# Patient Record
Sex: Female | Born: 1945 | Race: White | Hispanic: No | Smoking: Never smoker
Health system: Southern US, Community
[De-identification: ages and names within clinical notes are randomized; demographics above are authoritative.]

## PROBLEM LIST (undated history)

## (undated) DIAGNOSIS — R000 Tachycardia, unspecified: Secondary | ICD-10-CM

## (undated) DIAGNOSIS — G8929 Other chronic pain: Secondary | ICD-10-CM

## (undated) DIAGNOSIS — M2550 Pain in unspecified joint: Secondary | ICD-10-CM

## (undated) DIAGNOSIS — R519 Headache, unspecified: Secondary | ICD-10-CM

## (undated) DIAGNOSIS — J189 Pneumonia, unspecified organism: Secondary | ICD-10-CM

## (undated) DIAGNOSIS — R0600 Dyspnea, unspecified: Secondary | ICD-10-CM

## (undated) DIAGNOSIS — R51 Headache: Secondary | ICD-10-CM

## (undated) HISTORY — PX: UTERINE FIBROID SURGERY: SHX826

## (undated) HISTORY — DX: Dyspnea, unspecified: R06.00

## (undated) HISTORY — PX: TONSILLECTOMY: SHX5217

---

## 2000-01-05 ENCOUNTER — Encounter: Payer: Self-pay | Admitting: Obstetrics and Gynecology

## 2000-01-05 ENCOUNTER — Encounter: Admission: RE | Admit: 2000-01-05 | Discharge: 2000-01-05 | Payer: Self-pay | Admitting: Obstetrics and Gynecology

## 2013-12-24 ENCOUNTER — Other Ambulatory Visit: Payer: Self-pay | Admitting: Internal Medicine

## 2013-12-24 DIAGNOSIS — R0602 Shortness of breath: Secondary | ICD-10-CM

## 2013-12-25 ENCOUNTER — Ambulatory Visit
Admission: RE | Admit: 2013-12-25 | Discharge: 2013-12-25 | Disposition: A | Discharge status: Home / Self Care | Payer: Medicare Other | Source: Ambulatory Visit | Attending: Internal Medicine | Admitting: Internal Medicine

## 2013-12-25 DIAGNOSIS — R0602 Shortness of breath: Secondary | ICD-10-CM

## 2013-12-25 MED ORDER — IOHEXOL 300 MG/ML  SOLN
75.0000 mL | Freq: Once | INTRAMUSCULAR | Status: AC | PRN
  Administered 2013-12-25: 75 mL via INTRAVENOUS

## 2014-01-01 ENCOUNTER — Telehealth: Payer: Self-pay | Admitting: Emergency Medicine

## 2014-01-01 ENCOUNTER — Encounter (INDEPENDENT_AMBULATORY_CARE_PROVIDER_SITE_OTHER): Payer: Self-pay

## 2014-01-01 ENCOUNTER — Encounter: Payer: Self-pay | Admitting: Internal Medicine

## 2014-01-01 ENCOUNTER — Ambulatory Visit (INDEPENDENT_AMBULATORY_CARE_PROVIDER_SITE_OTHER): Payer: Medicare Other | Admitting: Internal Medicine

## 2014-01-01 VITALS — BP 110/80 | HR 90 | Ht 64.0 in | Wt 138.0 lb

## 2014-01-01 DIAGNOSIS — R599 Enlarged lymph nodes, unspecified: Secondary | ICD-10-CM

## 2014-01-01 DIAGNOSIS — R590 Localized enlarged lymph nodes: Secondary | ICD-10-CM

## 2014-01-01 DIAGNOSIS — D6862 Lupus anticoagulant syndrome: Secondary | ICD-10-CM

## 2014-01-01 DIAGNOSIS — R0989 Other specified symptoms and signs involving the circulatory and respiratory systems: Secondary | ICD-10-CM

## 2014-01-01 DIAGNOSIS — J849 Interstitial pulmonary disease, unspecified: Secondary | ICD-10-CM

## 2014-01-01 NOTE — Patient Instructions (Signed)
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 Depending on those results, we will order the balance next few days  - also refer to DR Gavin Pound for a sooner appointment  Followup  - depending on results of blood tests, will arrange for biopsy - likely bronchoscopy as first step

## 2014-01-01 NOTE — Telephone Encounter (Signed)
Per MR's request.   Called and left a message for medical records to have the pt's autoimmune labs faxed to our office.   Labs are: ACE level, MPO/pr-3, ANA, DS DNA, ESR, RF, anti-CCP, scl 70, ssA, ssB, CK total, aldolase, hypersensitivity pneumonitis panel, ANCA.

## 2014-01-01 NOTE — Progress Notes (Signed)
Subjective:    Patient ID: [name], female    DOB: 03-29-1945, 68 y.o.   MRN: [id number] PCP Merrilee Seashore, MD  HPI  IOV 01/01/2014  Chief Complaint  Patient presents with  . Pulmonary Consult    Pt referred by Dr. Ashby Dawes for SOB with activity.     68 year old female  Background history of systemic lupus erythematosus not otherwise specified. According to her history this was diagnosed in the 60s in [facility] by then rheumatologist Dr. Derryl Harbor. This was based on a history of arthralgia and fleeting malar rash and fatigue. Patient was on observation only therapy and subsequently symptoms resolved. Then in 2012 apparently she had some recurrence of symptoms and was seen by Dr. Gavin Pound and was discharged from follow-up. Since then she has done well until now.  Most recently in February 2015 she developed insidious onset of dyspnea on exertion associated with some retrocardiac chest heaviness. This persisted for a few months within resolved but recurred again 3-4 months ago. It is brought on by exertion for walking incline or climbing 2 flights of stairs so basically class II dyspnea on exertion relieved by rest. She did not notice any dyspnea when we walked her 185 feet 3 laps on room air in the office. Symptoms might be somewhat progressive but they're not associated with wheezing, chest tightness, hemoptysis, orthopnea, paroxysmal nocturnal dyspnea or rash or arthralgia. There is some associated cough and postnasal drip. She describes the symptoms as moderate to severe in intensity.  In association with these symptoms she has had some up-and-down parotid enlargement which Dr. Ashby Dawes has elegantly documented in his referral note  She originally saw Dr. Ashby Dawes 12/16/2013. A follow-up visit was done 12/24/2013 with chest x-ray suggesting interstitial lung disease. This was followed by a CT scan of the chest with angiogram 12/25/2013 which I personally  reviewed showing significant mediastinal adenopathy and scattered bilateral groundglass opacities that is consistent with interstitial lung disease of NON-UIP pattern [does not fit in with IPF disease with honeycombing and bilateral subpleural disease and traction bronchiectasis]. There is also some abdominal lymphadenopathy   Walking desaturation test 185 feet 3 labs on room air in the pulmonary office: Did not desaturate  She tells a cardiac stress test with Dr. Einar Gip was negative. 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 I'm not sure that Dr Einar Gip is aware of this  At this point she has had cyclic citrulline antibody and rheumatoid factor done by Dr. Ashby Dawes and results are pending. Sedimentation rate at his office was only 18    has a past medical history of Dyspnea.   reports that she has never smoked. She has never used smokeless tobacco.   has past surgical history that includes Tonsillectomy and Uterine fibroid surgery.  Not on File   There is no immunization history on file for this patient.  Current outpatient prescriptions:aspirin 81 MG tablet, Take 81 mg by mouth daily., Disp: , Rfl: ;  levofloxacin (LEVAQUIN) 500 MG tablet, Take 1 tablet by mouth daily., Disp: , Rfl:    Review of Systems  Constitutional: Negative for fever and unexpected weight change.  HENT: Positive for congestion and postnasal drip. Negative for dental problem, ear pain, nosebleeds, rhinorrhea, sinus pressure, sneezing, sore throat and trouble swallowing.   Eyes: Negative for redness and itching.  Respiratory: Positive for cough and shortness of breath. Negative for chest tightness and wheezing.   Cardiovascular: Positive  for chest pain. Negative for palpitations and leg swelling.  Gastrointestinal: Negative for nausea and vomiting.  Genitourinary: Negative for dysuria.  Musculoskeletal: Negative for joint swelling.  Skin:  Negative for rash.  Neurological: Negative for headaches.  Hematological: Does not bruise/bleed easily.  Psychiatric/Behavioral: Negative for dysphoric mood. The patient is not nervous/anxious.        Objective:   Physical Exam  Vitals reviewed. Constitutional: She is oriented to person, place, and time. She appears well-developed and well-nourished. No distress.  HENT:  Head: Normocephalic and atraumatic.  Right Ear: External ear normal.  Left Ear: External ear normal.  Mouth/Throat: Oropharynx is clear and moist. No oropharyngeal exudate.  Eyes: Conjunctivae and EOM are normal. Pupils are equal, round, and reactive to light. Right eye exhibits no discharge. Left eye exhibits no discharge. No scleral icterus.  Neck: Normal range of motion. Neck supple. No JVD present. No tracheal deviation present. No thyromegaly present.  Cardiovascular: Normal rate, regular rhythm, normal heart sounds and intact distal pulses.  Exam reveals no gallop and no friction rub.   No murmur heard. Pulmonary/Chest: Effort normal. No respiratory distress. She has no wheezes. She has rales. She exhibits no tenderness.   scattered crackles  Abdominal: Soft. Bowel sounds are normal. She exhibits no distension and no mass. There is no tenderness. There is no rebound and no guarding.  Musculoskeletal: Normal range of motion. She exhibits no edema and no tenderness.  Lymphadenopathy:    She has no cervical adenopathy.  Neurological: She is alert and oriented to person, place, and time. She has normal reflexes. No cranial nerve deficit. She exhibits normal muscle tone. Coordination normal.  Skin: Skin is warm and dry. No rash noted. She is not diaphoretic. No erythema. No pallor.  Psychiatric: She has a normal mood and affect. Her behavior is normal. Judgment and thought content normal.    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 Most concerning is autoimmune disease such as vascular disease such as granulomatous polyangiitis, lupus. Alternate differential diagnosis is known autoimmune interstitial lung disease such as sarcoidosis, and NSIP, Boop. Malignant differential diagnoses include lymphoma. Infectious differential diagnoses include tuberculosis but highly unlikely  Plan - Will get PFT   - She needs extensive autoimmune profile testing. I will clarify with Dr. Ashby Dawes the extent of test she has had. In addition I will see if Dr. Gavin Pound can do a detailed rheumatologic evaluation on her as soon as possible  - If these are negative then she needs endobronchial ultrasound-guided transbronchial needle aspiration biopsy of the distal nodes along with bronchoalveolar lavage and possible transbronchial biopsy of the groundglass infiltrates  - If the bronchoscopy is nondiagnostic then we will need to move to surgical lung biopsy for interstitial lung disease and mediastinoscopy of the mediastinal nodes   Patient is aware of this plan. She understands the limitations of these procedures and the risks involved in brief detail only. She is willing to proceed as per plan    Dr. Brand Males, M.D., [facility].C.P Pulmonary and Critical Care Medicine Staff Physician [place] [facility]monary and Critical Care Pager: [phone], If no answer or between  15:00h - 7:00h: call [phone]  01/02/2014 1:05 AM

## 2014-01-02 NOTE — Telephone Encounter (Signed)
[name]  Depending on what you get from [facility]. I wil need all of the following  Serum: ACE, ANA, DS-DNA, RF, CCP ssA, ssB, scl-70, ANCA screen, MPO antibody, PR-3 antibody, Lupus anticoagulant profile, Total CK,  Aldolase,  Hypersensitivity Pneumonitis Panel. I think Dr Ashby Dawes did only RF and CCP  Order the things above they have not ordered  Keep me posted on  ABOVE and b) when she is seenig Dr Trudie Reed is because I would need to schedule bronch on her ASAP depending on all this   Dr. Brand Males, M.D., [facility].C.P Pulmonary and Critical Care Medicine Staff Physician [place] Pulmonary and Critical Care Pager: [phone], If no answer or between  15:00h - 7:00h: call [phone]  01/02/2014 1:12 AM

## 2014-01-02 NOTE — Telephone Encounter (Signed)
Received call from [place]. Dr. Ashby Dawes collected ANA, ESR and CCP. Labs faxed to triage. Labs placed in MR's look ats.  Called and spoke to pt. ADvised pt to come in to have labs drawn, pt stated she will come in on Friday 10/30 after her PFT to have labs drawn. Pt also stated she will call when she gets appt with Dr. Trudie Reed so I can inform MR. Will await call.  Will forward to MR to make aware.

## 2014-01-03 ENCOUNTER — Ambulatory Visit (HOSPITAL_COMMUNITY)
Admission: RE | Admit: 2014-01-03 | Discharge: 2014-01-03 | Disposition: A | Discharge status: Home / Self Care | Payer: Medicare Other | Source: Ambulatory Visit | Attending: Adult Health | Admitting: Adult Health

## 2014-01-03 ENCOUNTER — Other Ambulatory Visit: Payer: Medicare Other

## 2014-01-03 DIAGNOSIS — R0602 Shortness of breath: Secondary | ICD-10-CM | POA: Diagnosis present

## 2014-01-03 DIAGNOSIS — J849 Interstitial pulmonary disease, unspecified: Secondary | ICD-10-CM

## 2014-01-03 DIAGNOSIS — D6862 Lupus anticoagulant syndrome: Secondary | ICD-10-CM

## 2014-01-03 DIAGNOSIS — R062 Wheezing: Secondary | ICD-10-CM | POA: Insufficient documentation

## 2014-01-03 DIAGNOSIS — R590 Localized enlarged lymph nodes: Secondary | ICD-10-CM

## 2014-01-03 LAB — PULMONARY FUNCTION TEST
DL/VA % pred: 64 %
DL/VA: 3.15 ml/min/mmHg/L
DLCO UNC % PRED: 46 %
DLCO UNC: 11.59 ml/min/mmHg
FEF 25-75 Post: 1.11 L/sec
FEF 25-75 Pre: 1.08 L/sec
FEF2575-%Change-Post: 3 %
FEF2575-%Pred-Post: 54 %
FEF2575-%Pred-Pre: 52 %
FEV1-%CHANGE-POST: 0 %
FEV1-%PRED-POST: 72 %
FEV1-%Pred-Pre: 71 %
FEV1-Post: 1.73 L
FEV1-Pre: 1.72 L
FEV1FVC-%CHANGE-POST: 3 %
FEV1FVC-%Pred-Pre: 90 %
FEV6-%Change-Post: -2 %
FEV6-%PRED-PRE: 80 %
FEV6-%Pred-Post: 79 %
FEV6-POST: 2.39 L
FEV6-PRE: 2.44 L
FEV6FVC-%CHANGE-POST: 0 %
FEV6FVC-%PRED-PRE: 103 %
FEV6FVC-%Pred-Post: 104 %
FVC-%CHANGE-POST: -3 %
FVC-%PRED-PRE: 78 %
FVC-%Pred-Post: 75 %
FVC-PRE: 2.46 L
FVC-Post: 2.39 L
POST FEV6/FVC RATIO: 100 %
PRE FEV6/FVC RATIO: 99 %
Post FEV1/FVC ratio: 72 %
Pre FEV1/FVC ratio: 70 %
RV % PRED: 71 %
RV: 1.55 L
TLC % PRED: 81 %
TLC: 4.18 L

## 2014-01-03 MED ORDER — ALBUTEROL SULFATE (2.5 MG/3ML) 0.083% IN NEBU
2.5000 mg | INHALATION_SOLUTION | Freq: Once | RESPIRATORY_TRACT | Status: AC
  Administered 2014-01-03: 2.5 mg via RESPIRATORY_TRACT

## 2014-01-03 NOTE — Telephone Encounter (Signed)
Pt has called in stating she has an appointment with the rheumatologist on 01/09/14.  [name]

## 2014-01-03 NOTE — Telephone Encounter (Signed)
Will forward to MR to make aware that pt has appt with rheumatologist on 01/09/14.

## 2014-01-05 LAB — CK TOTAL AND CKMB (NOT AT ARMC)
CK, MB: 2.2 ng/mL (ref 0.0–5.0)
Relative Index: 5 — ABNORMAL HIGH (ref 0.0–4.0)
Total CK: 44 U/L (ref 7–177)

## 2014-01-05 LAB — RHEUMATOID FACTOR: Rhuematoid fact SerPl-aCnc: <10 IU/mL (ref <=14)

## 2014-01-05 NOTE — Telephone Encounter (Signed)
Thanks.  [name]  So, we need to fax all our rheumatology results to Dr Gavin Pound along with my note: well before the appt. On that day I 01/09/14 I will be in [name] but please leave message with Dr Trudie Reed for her to text me/call me; you can give my number.  Please send the note back so I Can track  Thanks  Dr. Brand Males, M.D., [facility].C.P Pulmonary and Critical Care Medicine Staff Physician [place] [facility]monary and Critical Care Pager: [phone], If no answer or between  15:00h - 7:00h: call [phone]  01/05/2014 8:03 PM

## 2014-01-06 ENCOUNTER — Encounter (HOSPITAL_COMMUNITY): Payer: Self-pay | Admitting: *Deleted

## 2014-01-06 LAB — ANCA TITERS: P-ANCA: 1:160 {titer} — ABNORMAL HIGH

## 2014-01-06 LAB — SJOGRENS SYNDROME-A EXTRACTABLE NUCLEAR ANTIBODY: SSA (Ro) (ENA) Antibody, IgG: <1

## 2014-01-06 LAB — ANCA SCREEN W REFLEX TITER
Atypical p-ANCA Screen: NEGATIVE
C-ANCA SCREEN: NEGATIVE
p-ANCA Screen: POSITIVE — AB

## 2014-01-06 LAB — MPO/PR-3 (ANCA) ANTIBODIES
Myeloperoxidase Abs: <1
Serine Protease 3: <1

## 2014-01-06 LAB — ANGIOTENSIN CONVERTING ENZYME: Angiotensin-Converting Enzyme: 64 U/L — ABNORMAL HIGH (ref 8–52)

## 2014-01-06 LAB — SJOGRENS SYNDROME-B EXTRACTABLE NUCLEAR ANTIBODY: SSB (LA) (ENA) ANTIBODY, IGG: NEGATIVE

## 2014-01-06 LAB — ANTI-DNA ANTIBODY, DOUBLE-STRANDED: ds DNA Ab: 2 IU/mL

## 2014-01-07 LAB — ALDOLASE: Aldolase: 6 U/L (ref <=8.1)

## 2014-01-07 NOTE — Telephone Encounter (Signed)
OV and labs sent. Called [facility] and gave MR's cell number for Dr. Trudie Reed to speak with MR about pt on appt date. [facility] is aware I sent OV and labs and to contact us back if they have not been received. Nothing further needed at this time.   Will forward to MR to make aware.

## 2014-01-09 NOTE — Telephone Encounter (Signed)
Rob  She has mediastinal adenopathy with infiltrates. ACE high. Autoimmune negative. Gavin Pound her rheum called me: autoimmune ruled out. DDx is sarcoid followed by lymphoma. I have her on EBUS schedule 01/20/14 Monday but I will be [place]. Can you do EBUS and Tbbx on 01/20/14 Monday for me?  Thanks  Dr. Brand Males, M.D., [facility].C.P Pulmonary and Critical Care Medicine Staff Physician [place] Pulmonary and Critical Care Pager: [phone], If no answer or between  15:00h - 7:00h: call [phone]  01/09/2014 11:01 PM

## 2014-01-11 LAB — HYPERSENSITIVITY PNUEMONITIS PROFILE

## 2014-01-13 NOTE — Telephone Encounter (Signed)
[name] returned call. Informed [name] of the change. [name] stated they do not page the physician on the case and unable to perform fluoro.   Will forward to MR to make aware.

## 2014-01-13 NOTE — Telephone Encounter (Signed)
Rob  Ptaient set for EBUS 01/20/14 with you. Please confirm on schedule. Also, when we asked for fluoro they at [facility] did not seem to think is possible. Please clarify with them. tHings needed  A. EBUS for mediastinal node - flow and cultures depending on site results  B. Lung infiltrates - BAL (cell count for sure and other stuff)  and transbronchial biopsy    Dr. Brand Males, M.D., [facility].C.P Pulmonary and Critical Care Medicine Staff Physician [place] Pulmonary and Critical Care Pager: [phone], If no answer or between  15:00h - 7:00h: call [phone]  01/13/2014 5:14 PM

## 2014-01-13 NOTE — Telephone Encounter (Signed)
Called and spoke to pt. Informed pt of the change.Pt verbalized understanding and denied any further questions or concerns at this time.  Called and left message with RT at [facility].

## 2014-01-13 NOTE — Telephone Encounter (Signed)
[name]  Let Kristin Bruins know that biopsy on 01/20/14 will be dne by Dr Baltazar Apo because of my travel. LEt her know Dr Lamonte Sakai expert in this procedure. Also, call [phone] and change procedure to his name and let them page Dr Lamonte Sakai with a time on the case. Let me know when confirmed  Thanks  Dr. Brand Males, M.D., [facility].C.P Pulmonary and Critical Care Medicine Staff Physician [place] Pulmonary and Critical Care Pager: [phone], If no answer or between  15:00h - 7:00h: call [phone]  01/13/2014 2:44 PM

## 2014-01-14 NOTE — Telephone Encounter (Signed)
[name]  Please tell [phone]  That after EBUS Dr Lamonte Sakai will do transbronchial biopsy and for this reason will need fluoro  THanks  Dr. Brand Males, M.D., [facility].C.P Pulmonary and Critical Care Medicine Staff Physician [place] Pulmonary and Critical Care Pager: [phone], If no answer or between  15:00h - 7:00h: call [phone]  01/14/2014 5:24 PM

## 2014-01-16 LAB — LUPUS ANTICOAGULANT PANEL
DRVVT: 41.1 s (ref <42.9)
Lupus Anticoagulant: NOT DETECTED
PTT Lupus Anticoagulant: 40.2 secs (ref 28.0–43.0)

## 2014-01-17 NOTE — Telephone Encounter (Signed)
Called and left message on RT's number.

## 2014-01-19 NOTE — Anesthesia Preprocedure Evaluation (Addendum)
Anesthesia Evaluation  Patient identified by MRN, date of birth, ID band Patient awake    Reviewed: Allergy & Precautions, H&P , NPO status , Patient's Chart, lab work & pertinent test results  Airway Mallampati: II  TM Distance: >3 FB Neck ROM: full    Dental no notable dental hx. (+) Teeth Intact, Dental Advisory Given   Pulmonary neg pulmonary ROS, shortness of breath and with exertion,  breath sounds clear to auscultation  Pulmonary exam normal       Cardiovascular Exercise Tolerance: Good negative cardio ROS  Rhythm:regular Rate:Normal     Neuro/Psych  Headaches, negative neurological ROS  negative psych ROS   GI/Hepatic negative GI ROS, Neg liver ROS,   Endo/Other  negative endocrine ROS  Renal/GU negative Renal ROS  negative genitourinary   Musculoskeletal   Abdominal   Peds  Hematology negative hematology ROS (+)   Anesthesia Other Findings   Reproductive/Obstetrics negative OB ROS                            Anesthesia Physical Anesthesia Plan  ASA: II  Anesthesia Plan: General   Post-op Pain Management:    Induction: Intravenous  Airway Management Planned: Oral ETT  Additional Equipment:   Intra-op Plan:   Post-operative Plan: Extubation in OR  Informed Consent: I have reviewed the patients History and Physical, chart, labs and discussed the procedure including the risks, benefits and alternatives for the proposed anesthesia with the patient or authorized representative who has indicated his/her understanding and acceptance.   Dental Advisory Given  Plan Discussed with: CRNA and Surgeon  Anesthesia Plan Comments:         Anesthesia Quick Evaluation

## 2014-01-20 ENCOUNTER — Observation Stay (HOSPITAL_COMMUNITY): Payer: Medicare Other

## 2014-01-20 ENCOUNTER — Ambulatory Visit (HOSPITAL_COMMUNITY)
Admission: RE | Admit: 2014-01-20 | Discharge: 2014-01-20 | Disposition: A | Discharge status: Home / Self Care | Payer: Medicare Other | Source: Ambulatory Visit | Attending: Internal Medicine | Admitting: Internal Medicine

## 2014-01-20 ENCOUNTER — Telehealth: Payer: Self-pay | Admitting: Emergency Medicine

## 2014-01-20 ENCOUNTER — Encounter (HOSPITAL_COMMUNITY): Payer: Self-pay | Admitting: *Deleted

## 2014-01-20 ENCOUNTER — Ambulatory Visit (HOSPITAL_COMMUNITY): Payer: Medicare Other | Admitting: Anesthesiology

## 2014-01-20 ENCOUNTER — Encounter (HOSPITAL_COMMUNITY)
Admission: RE | Disposition: A | Discharge status: Home / Self Care | Payer: Self-pay | Source: Ambulatory Visit | Attending: Emergency Medicine

## 2014-01-20 ENCOUNTER — Ambulatory Visit (HOSPITAL_COMMUNITY): Payer: Medicare Other

## 2014-01-20 ENCOUNTER — Observation Stay (HOSPITAL_COMMUNITY)
Admission: RE | Admit: 2014-01-20 | Discharge: 2014-01-21 | Disposition: A | Discharge status: Home / Self Care | Payer: Medicare Other | Source: Ambulatory Visit | Attending: Emergency Medicine | Admitting: Emergency Medicine

## 2014-01-20 DIAGNOSIS — J849 Interstitial pulmonary disease, unspecified: Secondary | ICD-10-CM | POA: Diagnosis present

## 2014-01-20 DIAGNOSIS — Z79899 Other long term (current) drug therapy: Secondary | ICD-10-CM | POA: Diagnosis not present

## 2014-01-20 DIAGNOSIS — Z9889 Other specified postprocedural states: Secondary | ICD-10-CM

## 2014-01-20 DIAGNOSIS — J939 Pneumothorax, unspecified: Secondary | ICD-10-CM | POA: Diagnosis present

## 2014-01-20 DIAGNOSIS — R918 Other nonspecific abnormal finding of lung field: Secondary | ICD-10-CM | POA: Insufficient documentation

## 2014-01-20 DIAGNOSIS — R591 Generalized enlarged lymph nodes: Secondary | ICD-10-CM | POA: Insufficient documentation

## 2014-01-20 DIAGNOSIS — D709 Neutropenia, unspecified: Secondary | ICD-10-CM | POA: Diagnosis not present

## 2014-01-20 DIAGNOSIS — M329 Systemic lupus erythematosus, unspecified: Secondary | ICD-10-CM | POA: Insufficient documentation

## 2014-01-20 DIAGNOSIS — R590 Localized enlarged lymph nodes: Secondary | ICD-10-CM | POA: Diagnosis present

## 2014-01-20 DIAGNOSIS — Z7982 Long term (current) use of aspirin: Secondary | ICD-10-CM | POA: Diagnosis not present

## 2014-01-20 HISTORY — PX: ENDOBRONCHIAL ULTRASOUND: SHX5096

## 2014-01-20 HISTORY — DX: Headache, unspecified: R51.9

## 2014-01-20 HISTORY — DX: Headache: R51

## 2014-01-20 LAB — CBC
HCT: 37.2 % (ref 36.0–46.0)
Hemoglobin: 12.7 g/dL (ref 12.0–15.0)
MCH: 28.7 pg (ref 26.0–34.0)
MCHC: 34.1 g/dL (ref 30.0–36.0)
MCV: 84 fL (ref 78.0–100.0)
PLATELETS: 252 10*3/uL (ref 150–400)
RBC: 4.43 MIL/uL (ref 3.87–5.11)
RDW: 14.5 % (ref 11.5–15.5)
WBC: 1.5 10*3/uL — AB (ref 4.0–10.5)

## 2014-01-20 LAB — BASIC METABOLIC PANEL
ANION GAP: 10 (ref 5–15)
BUN: 14 mg/dL (ref 6–23)
CO2: 25 mEq/L (ref 19–32)
Calcium: 8.7 mg/dL (ref 8.4–10.5)
Chloride: 104 mEq/L (ref 96–112)
Creatinine, Ser: 0.95 mg/dL (ref 0.50–1.10)
GFR, EST AFRICAN AMERICAN: 70 mL/min — AB (ref >90)
GFR, EST NON AFRICAN AMERICAN: 61 mL/min — AB (ref >90)
Glucose, Bld: 98 mg/dL (ref 70–99)
Potassium: 4.2 mEq/L (ref 3.7–5.3)
Sodium: 139 mEq/L (ref 137–147)

## 2014-01-20 LAB — MRSA PCR SCREENING: MRSA by PCR: NEGATIVE

## 2014-01-20 LAB — GLUCOSE, CAPILLARY: Glucose-Capillary: 93 mg/dL (ref 70–99)

## 2014-01-20 SURGERY — ENDOBRONCHIAL ULTRASOUND (EBUS)
Anesthesia: General | Laterality: Bilateral

## 2014-01-20 MED ORDER — PROPOFOL 10 MG/ML IV BOLUS
INTRAVENOUS | Status: AC
  Filled 2014-01-20: qty 20

## 2014-01-20 MED ORDER — PHENYLEPHRINE 40 MCG/ML (10ML) SYRINGE FOR IV PUSH (FOR BLOOD PRESSURE SUPPORT)
PREFILLED_SYRINGE | INTRAVENOUS | Status: AC
  Filled 2014-01-20: qty 10

## 2014-01-20 MED ORDER — FENTANYL CITRATE 0.05 MG/ML IJ SOLN: 25.0000 ug | INTRAMUSCULAR | Status: DC | PRN

## 2014-01-20 MED ORDER — CISATRACURIUM BESYLATE 20 MG/10ML IV SOLN
INTRAVENOUS | Status: AC
  Filled 2014-01-20: qty 10

## 2014-01-20 MED ORDER — FENTANYL CITRATE 0.05 MG/ML IJ SOLN
INTRAMUSCULAR | Status: AC
  Filled 2014-01-20: qty 2

## 2014-01-20 MED ORDER — MIDAZOLAM HCL 2 MG/2ML IJ SOLN
INTRAMUSCULAR | Status: AC
  Filled 2014-01-20: qty 2

## 2014-01-20 MED ORDER — CISATRACURIUM BESYLATE (PF) 10 MG/5ML IV SOLN
INTRAVENOUS | Status: DC | PRN
  Administered 2014-01-20: 4 mg via INTRAVENOUS

## 2014-01-20 MED ORDER — LACTATED RINGERS IV SOLN
INTRAVENOUS | Status: DC
  Administered 2014-01-20 (×2): via INTRAVENOUS

## 2014-01-20 MED ORDER — NEOSTIGMINE METHYLSULFATE 10 MG/10ML IV SOLN
INTRAVENOUS | Status: AC
  Filled 2014-01-20: qty 1

## 2014-01-20 MED ORDER — ONDANSETRON HCL 4 MG/2ML IJ SOLN
INTRAMUSCULAR | Status: AC
  Filled 2014-01-20: qty 2

## 2014-01-20 MED ORDER — ONDANSETRON HCL 4 MG/2ML IJ SOLN
INTRAMUSCULAR | Status: DC | PRN
  Administered 2014-01-20: 4 mg via INTRAVENOUS

## 2014-01-20 MED ORDER — HEPARIN SODIUM (PORCINE) 5000 UNIT/ML IJ SOLN
5000.0000 [IU] | Freq: Three times a day (TID) | INTRAMUSCULAR | Status: DC
  Administered 2014-01-20 – 2014-01-21 (×2): 5000 [IU] via SUBCUTANEOUS
  Filled 2014-01-20 (×2): qty 1

## 2014-01-20 MED ORDER — NEOSTIGMINE METHYLSULFATE 10 MG/10ML IV SOLN
INTRAVENOUS | Status: DC | PRN
  Administered 2014-01-20: 4 mg via INTRAVENOUS

## 2014-01-20 MED ORDER — SODIUM CHLORIDE 0.9 % IV SOLN: 250.0000 mL | INTRAVENOUS | Status: DC | PRN

## 2014-01-20 MED ORDER — SODIUM CHLORIDE 0.9 % IV SOLN: INTRAVENOUS | Status: DC

## 2014-01-20 MED ORDER — FENTANYL CITRATE 0.05 MG/ML IJ SOLN
INTRAMUSCULAR | Status: AC
  Filled 2014-01-20: qty 4

## 2014-01-20 MED ORDER — GLYCOPYRROLATE 0.2 MG/ML IJ SOLN
INTRAMUSCULAR | Status: AC
  Filled 2014-01-20: qty 3

## 2014-01-20 MED ORDER — SUCCINYLCHOLINE CHLORIDE 20 MG/ML IJ SOLN
INTRAMUSCULAR | Status: DC | PRN
  Administered 2014-01-20: 100 mg via INTRAVENOUS

## 2014-01-20 MED ORDER — ALBUTEROL SULFATE (2.5 MG/3ML) 0.083% IN NEBU: 2.5000 mg | INHALATION_SOLUTION | RESPIRATORY_TRACT | Status: DC | PRN

## 2014-01-20 MED ORDER — GLYCOPYRROLATE 0.2 MG/ML IJ SOLN
INTRAMUSCULAR | Status: DC | PRN
  Administered 2014-01-20: 0.6 mg via INTRAVENOUS

## 2014-01-20 MED ORDER — ASPIRIN 81 MG PO CHEW
81.0000 mg | CHEWABLE_TABLET | Freq: Every morning | ORAL | Status: DC
  Administered 2014-01-21: 81 mg via ORAL
  Filled 2014-01-20: qty 1

## 2014-01-20 MED ORDER — ONDANSETRON HCL 4 MG/2ML IJ SOLN: 4.0000 mg | Freq: Four times a day (QID) | INTRAMUSCULAR | Status: DC | PRN

## 2014-01-20 MED ORDER — PHENYLEPHRINE HCL 10 MG/ML IJ SOLN
INTRAMUSCULAR | Status: DC | PRN
  Administered 2014-01-20 (×2): 120 ug via INTRAVENOUS

## 2014-01-20 MED ORDER — PROPOFOL 10 MG/ML IV BOLUS
INTRAVENOUS | Status: DC | PRN
  Administered 2014-01-20: 130 mg via INTRAVENOUS

## 2014-01-20 MED ORDER — FENTANYL CITRATE 0.05 MG/ML IJ SOLN
INTRAMUSCULAR | Status: DC | PRN
  Administered 2014-01-20: 50 ug via INTRAVENOUS

## 2014-01-20 MED ORDER — ACETAMINOPHEN 325 MG PO TABS: 650.0000 mg | ORAL_TABLET | Freq: Four times a day (QID) | ORAL | Status: DC | PRN

## 2014-01-20 NOTE — Progress Notes (Signed)
Small right apical pneumothorax found on xray. Dr. Lamonte Sakai notified and patient is going to be kept over night. Patient and family notified. Pts VSS. Os sat 96% on 2LNC. [facility]

## 2014-01-20 NOTE — Plan of Care (Signed)
Problem: ICU Phase Progression Outcomes Goal: Dyspnea controlled at rest Outcome: Completed/Met Date Met:  01/20/14

## 2014-01-20 NOTE — H&P (Signed)
Name: [name] MRN: [id number] DOB: 1945-10-22    ADMISSION DATE:  01/20/2014  CHIEF COMPLAINT:  Pneumothorax  BRIEF PATIENT DESCRIPTION: 68 y/o F with PMH of Lupus who had a recent CT in 12/2013 that was suggestive of ILD.  She was admitted for FOB / EBUS + biopsy to characterize LAD, B pulmonary infiltrates and dyspnea.  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 Post bronch, CXR noted PTX.  Admitted for monitoring.    SIGNIFICANT EVENTS  11/16  Admit after FOB/EBUS with bx for PTX  STUDIES:  11/16  CXR (post FOB) >> small R apical PTX after biopsy, dense bibasilar nodular opacities   HISTORY OF PRESENT ILLNESS:  68 y/o F with PMH of Lupus (diagnosed in the 1990's by Dr. Lara Mulch based on hx of arthralgia, fatigue, and fleeting malar rash), symptoms resolved at that time.  She had a second flare of symptoms in 2012 and was seen by Dr. Trudie Reed and again discharged and has done well.  In 04/2013 she developed insidious onset of dyspnea on exertion associated with some retrocardiac chest heaviness. This persisted for a few months within resolved but recurred again 3-4 months ago. It is brought on by exertion for walking incline or climbing 2 flights of stairs so basically class II dyspnea on exertion relieved by rest. She did not notice any dyspnea when we walked her 185 feet 3 laps on room air in the office. She had a negative cardiac stress test with Dr. Einar Gip.  However, it was positive for High-grade atherosclerotic stenosis of the left subclavian artery, at the vertebral ostium.  She was evaluated in 12/2103 by Dr. Ashby Dawes with a CXR that was suggestive of ILD.  This was followed up with a CTA of the chest on 10/21 which was positive for significant mediastinal adenopathy and scattered bilateral groundglass opacities that is consistent with interstitial lung disease of NON-UIP pattern  [does not fit in with IPF disease with honeycombing and bilateral subpleural disease and traction bronchiectasis]. There is also some abdominal lymphadenopathy.    The patient was admitted for FOB / EBUS + biopsy on 11/16 to characterize LAD, B pulmonary infiltrates and dyspnea.  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 Post bronch, CXR noted PTX.     PAST MEDICAL HISTORY :   has a past medical history of Dyspnea and Headache.  has past surgical history that includes Tonsillectomy and Uterine fibroid surgery.   Prior to Admission medications   Medication Sig Start Date End Date Taking? Authorizing Provider  acetaminophen (TYLENOL) 325 MG tablet Take 650 mg by mouth 2 (two) times daily.   Yes Historical Provider, MD  aspirin 81 MG tablet Take 81 mg by mouth every morning.    Yes Historical Provider, MD  levofloxacin (LEVAQUIN) 500 MG tablet Take 1 tablet by mouth daily.   Yes Historical Provider, MD   No Known Allergies  FAMILY HISTORY:  family history includes Diabetes Mellitus II in her mother; Emphysema in her father; Heart disease in her father and mother.   SOCIAL HISTORY:  reports that she has never smoked. She has never used smokeless tobacco. She reports that she drinks alcohol. She reports that she does not use illicit drugs.  REVIEW OF SYSTEMS:   Constitutional: Negative for fever, chills, weight loss, malaise/fatigue and diaphoresis.  HENT: Negative for  hearing loss, ear pain, nosebleeds, congestion, sore throat, neck pain, tinnitus and ear discharge.   Eyes: Negative for blurred vision, double vision, photophobia, pain, discharge and redness.  Respiratory: Negative for cough, hemoptysis, sputum production, wheezing and stridor.  +for SOB, dry cough, hoarse voice, intermittent sharp chest pains for last 7-10 days pta Cardiovascular: Negative for chest pain, palpitations,  orthopnea, claudication, leg swelling and PND.  Gastrointestinal: Negative for heartburn, nausea, vomiting, abdominal pain, diarrhea, constipation, blood in stool and melena.  Genitourinary: Negative for dysuria, urgency, frequency, hematuria and flank pain.  Musculoskeletal: Negative for myalgias, back pain, joint pain and falls.  Skin: Negative for itching and rash.  Neurological: Negative for dizziness, tingling, tremors, sensory change, speech change, focal weakness, seizures, loss of consciousness, weakness and headaches.  Endo/Heme/Allergies: Negative for environmental allergies and polydipsia. Does not bruise/bleed easily.  SUBJECTIVE:   VITAL SIGNS: Temp:  [97.8 F (36.6 C)] 97.8 F (36.6 C) (11/16 0805) Pulse Rate:  [79-97] 90 (11/16 1235) Resp:  [15-29] 17 (11/16 1235) BP: (116-157)/(59-104) 133/69 mmHg (11/16 1235) SpO2:  [91 %-100 %] 98 % (11/16 1235)  PHYSICAL EXAMINATION: General:  wdwn adult female in NAD Neuro:  AAOx4, speech clear, MAE HEENT:  Mm pink/moist, no jvd Cardiovascular:  s1s2 rrr, no m/r/g Lungs:  resp's even/non-labored, lungs bilaterally clear, good breath sounds bilaterally  Abdomen:  Round/soft, bsx4 active, non-tender to palpation  Musculoskeletal:  No acute deformities  Skin:  Warm/dry, no edema   No results for input(s): NA, K, CL, CO2, BUN, CREATININE, GLUCOSE in the last 168 hours. No results for input(s): HGB, HCT, WBC, PLT in the last 168 hours. Dg Chest Port 1 View  01/20/2014   CLINICAL DATA:  Right middle lobe biopsy/bronchoscopy  EXAM: PORTABLE CHEST - 1 VIEW  COMPARISON:  Radiograph 12/16/2013  FINDINGS: There is a small right apical pneumothorax following biopsy. The pleural edge measures 13 mm from the chest wall.  Normal cardiac silhouette.  There is dense bibasilar patchy airspace disease not changed from comparison CT or radiograph. No aggressive osseous lesion.  IMPRESSION: 1. Small right apical pneumothorax following biopsy. 2. No  change in dense bibasilar nodular airspace opacities. Critical Value/emergent results were called by telephone at the time of interpretation on 01/20/2014 at 10:41 am to Dr. Judson Roch Monday, nurse, who verbally acknowledged these results.   Electronically Signed   By: Suzy Bouchard M.D.   On: 01/20/2014 10:42    ASSESSMENT / PLAN:  R Pneumothorax - post FOB/EBUS with biopsy Bilateral Pulmonary Infiltrates  Dyspnea R/O Eosinophilic Granulomatosis with Polyangiitis   Plan: Admit for monitoring 50% VM  Follow up CXR at 1500 and in AM Await biopsy results  PRN albuterol for SOB Follow up BMP, CBC in am  DVT: heparin SQ Regular diet    Noe Gens, NP-C [place] [facility] & Critical [facility] Pgr: [phone] or [phone] 01/20/2014, 12:37 PM   Attending Note I have seen and examined the patient, reviewed films and labs, discussed case with B Ollis. I agree with the data and note as amended above. Pt is s/p FOB, EBUS, RML TBBx's c/b a small R PTX. Will admit her for observation and repeat her CXR for stability. If the PTX enlarges then will place pigtail chest tube.   Baltazar Apo, MD, PhD 01/20/2014, 3:26 PM  Pulmonary and Critical Care [phone] or if no answer [phone]

## 2014-01-20 NOTE — OR Nursing (Signed)
Called the number provided on the face sheet for this patient to confirm that she had a procedure scheduled and was told I had the wrong number.

## 2014-01-20 NOTE — Anesthesia Postprocedure Evaluation (Signed)
  Anesthesia Post-op Note  Patient: [name]  Procedure(s) Performed: Procedure(s) (LRB): ENDOBRONCHIAL ULTRASOUND (Bilateral)  Patient Location: PACU  Anesthesia Type: General  Level of Consciousness: awake and alert   Airway and Oxygen Therapy: Patient Spontanous Breathing  Post-op Pain: mild  Post-op Assessment: Post-op Vital signs reviewed, Patient's Cardiovascular Status Stable, Respiratory Function Stable, Patent Airway and No signs of Nausea or vomiting  Last Vitals:  Filed Vitals:   01/20/14 1050  BP: 152/73  Pulse: 81  Temp:   Resp: 21    Post-op Vital Signs: stable   Complications: No apparent anesthesia complications

## 2014-01-20 NOTE — Transfer of Care (Signed)
Immediate Anesthesia Transfer of Care Note  Patient: [name]  Procedure(s) Performed: Procedure(s): ENDOBRONCHIAL ULTRASOUND (Bilateral)  Patient Location: PACU  Anesthesia Type:General  Level of Consciousness: awake, sedated and patient cooperative  Airway & Oxygen Therapy: Patient Spontanous Breathing and Patient connected to face mask oxygen  Post-op Assessment: Report given to PACU RN and Post -op Vital signs reviewed and stable  Post vital signs: Reviewed and stable  Complications: No apparent anesthesia complications

## 2014-01-20 NOTE — H&P (View-Only) (Signed)
Subjective:    Patient ID: [name], female    DOB: [birth year], 68 y.o.   MRN: [id number] PCP Merrilee Seashore, MD  HPI  IOV 01/01/2014  Chief Complaint  Patient presents with  . Pulmonary Consult    Pt referred by Dr. Ashby Dawes for SOB with activity.     68 year old female  Background history of systemic lupus erythematosus not otherwise specified. According to her history this was diagnosed in the 27s in [facility] by then rheumatologist Dr. Derryl Harbor. This was based on a history of arthralgia and fleeting malar rash and fatigue. Patient was on observation only therapy and subsequently symptoms resolved. Then in 2012 apparently she had some recurrence of symptoms and was seen by Dr. Gavin Pound and was discharged from follow-up. Since then she has done well until now.  Most recently in February 2015 she developed insidious onset of dyspnea on exertion associated with some retrocardiac chest heaviness. This persisted for a few months within resolved but recurred again 3-4 months ago. It is brought on by exertion for walking incline or climbing 2 flights of stairs so basically class II dyspnea on exertion relieved by rest. She did not notice any dyspnea when we walked her 185 feet 3 laps on room air in the office. Symptoms might be somewhat progressive but they're not associated with wheezing, chest tightness, hemoptysis, orthopnea, paroxysmal nocturnal dyspnea or rash or arthralgia. There is some associated cough and postnasal drip. She describes the symptoms as moderate to severe in intensity.  In association with these symptoms she has had some up-and-down parotid enlargement which Dr. Ashby Dawes has elegantly documented in his referral note  She originally saw Dr. Ashby Dawes 12/16/2013. A follow-up visit was done 12/24/2013 with chest x-ray suggesting interstitial lung disease. This was followed by a CT scan of the chest with angiogram 12/25/2013 which I personally  reviewed showing significant mediastinal adenopathy and scattered bilateral groundglass opacities that is consistent with interstitial lung disease of NON-UIP pattern [does not fit in with IPF disease with honeycombing and bilateral subpleural disease and traction bronchiectasis]. There is also some abdominal lymphadenopathy   Walking desaturation test 185 feet 3 labs on room air in the pulmonary office: Did not desaturate  She tells a cardiac stress test with Dr. Einar Gip was negative. 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 I'm not sure that Dr Einar Gip is aware of this  At this point she has had cyclic citrulline antibody and rheumatoid factor done by Dr. Ashby Dawes and results are pending. Sedimentation rate at his office was only 18    has a past medical history of Dyspnea.   reports that she has never smoked. She has never used smokeless tobacco.   has past surgical history that includes Tonsillectomy and Uterine fibroid surgery.  Not on File   There is no immunization history on file for this patient.  Current outpatient prescriptions:aspirin 81 MG tablet, Take 81 mg by mouth daily., Disp: , Rfl: ;  levofloxacin (LEVAQUIN) 500 MG tablet, Take 1 tablet by mouth daily., Disp: , Rfl:    Review of Systems  Constitutional: Negative for fever and unexpected weight change.  HENT: Positive for congestion and postnasal drip. Negative for dental problem, ear pain, nosebleeds, rhinorrhea, sinus pressure, sneezing, sore throat and trouble swallowing.   Eyes: Negative for redness and itching.  Respiratory: Positive for cough and shortness of breath. Negative for chest tightness and wheezing.   Cardiovascular: Positive  for chest pain. Negative for palpitations and leg swelling.  Gastrointestinal: Negative for nausea and vomiting.  Genitourinary: Negative for dysuria.  Musculoskeletal: Negative for joint swelling.  Skin:  Negative for rash.  Neurological: Negative for headaches.  Hematological: Does not bruise/bleed easily.  Psychiatric/Behavioral: Negative for dysphoric mood. The patient is not nervous/anxious.        Objective:   Physical Exam  Vitals reviewed. Constitutional: She is oriented to person, place, and time. She appears well-developed and well-nourished. No distress.  HENT:  Head: Normocephalic and atraumatic.  Right Ear: External ear normal.  Left Ear: External ear normal.  Mouth/Throat: Oropharynx is clear and moist. No oropharyngeal exudate.  Eyes: Conjunctivae and EOM are normal. Pupils are equal, round, and reactive to light. Right eye exhibits no discharge. Left eye exhibits no discharge. No scleral icterus.  Neck: Normal range of motion. Neck supple. No JVD present. No tracheal deviation present. No thyromegaly present.  Cardiovascular: Normal rate, regular rhythm, normal heart sounds and intact distal pulses.  Exam reveals no gallop and no friction rub.   No murmur heard. Pulmonary/Chest: Effort normal. No respiratory distress. She has no wheezes. She has rales. She exhibits no tenderness.   scattered crackles  Abdominal: Soft. Bowel sounds are normal. She exhibits no distension and no mass. There is no tenderness. There is no rebound and no guarding.  Musculoskeletal: Normal range of motion. She exhibits no edema and no tenderness.  Lymphadenopathy:    She has no cervical adenopathy.  Neurological: She is alert and oriented to person, place, and time. She has normal reflexes. No cranial nerve deficit. She exhibits normal muscle tone. Coordination normal.  Skin: Skin is warm and dry. No rash noted. She is not diaphoretic. No erythema. No pallor.  Psychiatric: She has a normal mood and affect. Her behavior is normal. Judgment and thought content normal.    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 Most concerning is autoimmune disease such as vascular disease such as granulomatous polyangiitis, lupus. Alternate differential diagnosis is known autoimmune interstitial lung disease such as sarcoidosis, and NSIP, Boop. Malignant differential diagnoses include lymphoma. Infectious differential diagnoses include tuberculosis but highly unlikely  Plan - Will get PFT   - She needs extensive autoimmune profile testing. I will clarify with Dr. Ashby Dawes the extent of test she has had. In addition I will see if Dr. Gavin Pound can do a detailed rheumatologic evaluation on her as soon as possible  - If these are negative then she needs endobronchial ultrasound-guided transbronchial needle aspiration biopsy of the distal nodes along with bronchoalveolar lavage and possible transbronchial biopsy of the groundglass infiltrates  - If the bronchoscopy is nondiagnostic then we will need to move to surgical lung biopsy for interstitial lung disease and mediastinoscopy of the mediastinal nodes   Patient is aware of this plan. She understands the limitations of these procedures and the risks involved in brief detail only. She is willing to proceed as per plan    Dr. Brand Males, M.D., [facility].C.P Pulmonary and Critical Care Medicine Staff Physician [place] [facility] Pager: [phone], If no answer or between  15:00h - 7:00h: call [phone]  01/02/2014 1:05 AM

## 2014-01-20 NOTE — Plan of Care (Signed)
Problem: ICU Phase Progression Outcomes Goal: O2 sats trending toward baseline Outcome: Completed/Met Date Met:  01/20/14

## 2014-01-20 NOTE — Plan of Care (Signed)
Problem: ICU Phase Progression Outcomes Goal: Voiding-avoid urinary catheter unless indicated Outcome: Completed/Met Date Met:  01/20/14

## 2014-01-20 NOTE — Interval H&P Note (Signed)
Pt presents for FOB + EBUS and bx';s to characterize LAD, B pulm infiltrates, dyspnea. 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 She notes no changes in her clinical status.   Plans:  FOB + EBUS today for nodal bx's, TBBx's, and BAL for FLOW  Baltazar Apo, MD, PhD 01/20/2014, 8:19 AM [facility] [phone] or if no answer [phone]

## 2014-01-20 NOTE — Progress Notes (Signed)
Following EBUS procedure video bronchoscopy performed. Intervention bronchial biopsy Intervention bronchial washings Pt tolerated well  Kathie Dike RRT   Baltazar Apo, MD, PhD 01/20/2014, 3:27 PM [facility] [phone] or if no answer [phone]

## 2014-01-21 ENCOUNTER — Observation Stay (HOSPITAL_COMMUNITY): Payer: Medicare Other

## 2014-01-21 ENCOUNTER — Encounter (HOSPITAL_COMMUNITY): Payer: Self-pay | Admitting: Emergency Medicine

## 2014-01-21 DIAGNOSIS — Z9889 Other specified postprocedural states: Secondary | ICD-10-CM

## 2014-01-21 DIAGNOSIS — J849 Interstitial pulmonary disease, unspecified: Secondary | ICD-10-CM

## 2014-01-21 DIAGNOSIS — J939 Pneumothorax, unspecified: Secondary | ICD-10-CM | POA: Diagnosis not present

## 2014-01-21 DIAGNOSIS — R599 Enlarged lymph nodes, unspecified: Secondary | ICD-10-CM

## 2014-01-21 LAB — BASIC METABOLIC PANEL
ANION GAP: 10 (ref 5–15)
BUN: 9 mg/dL (ref 6–23)
CHLORIDE: 104 meq/L (ref 96–112)
CO2: 26 mEq/L (ref 19–32)
Calcium: 8.8 mg/dL (ref 8.4–10.5)
Creatinine, Ser: 0.9 mg/dL (ref 0.50–1.10)
GFR calc non Af Amer: 65 mL/min — ABNORMAL LOW (ref >90)
GFR, EST AFRICAN AMERICAN: 75 mL/min — AB (ref >90)
Glucose, Bld: 108 mg/dL — ABNORMAL HIGH (ref 70–99)
Potassium: 4.2 mEq/L (ref 3.7–5.3)
SODIUM: 140 meq/L (ref 137–147)

## 2014-01-21 LAB — CBC
HCT: 39.9 % (ref 36.0–46.0)
Hemoglobin: 13.4 g/dL (ref 12.0–15.0)
MCH: 28.6 pg (ref 26.0–34.0)
MCHC: 33.6 g/dL (ref 30.0–36.0)
MCV: 85.3 fL (ref 78.0–100.0)
PLATELETS: 249 10*3/uL (ref 150–400)
RBC: 4.68 MIL/uL (ref 3.87–5.11)
RDW: 14.4 % (ref 11.5–15.5)
WBC: 1.5 10*3/uL — AB (ref 4.0–10.5)

## 2014-01-21 NOTE — Discharge Instructions (Signed)
Flexible Bronchoscopy, Care After These instructions give you information on caring for yourself after your procedure. Your doctor may also give you more specific instructions. Call your doctor if you have any problems or questions after your procedure. HOME CARE  Do not eat or drink anything for 2 hours after your procedure. If you try to eat or drink before the medicine wears off, food or drink could go into your lungs. You could also burn yourself.  After 2 hours have passed and when you can cough and gag normally, you may eat soft food and drink liquids slowly.  The day after the test, you may eat your normal diet.  You may do your normal activities.  Keep all doctor visits. GET HELP RIGHT AWAY IF:  You get more and more short of breath.  You get light-headed.  You feel like you are going to pass out (faint).  You have chest pain.  You have new problems that worry you.  You cough up more than a little blood.  You cough up more blood than before. MAKE SURE YOU:  Understand these instructions.  Will watch your condition.  Will get help right away if you are not doing well or get worse. Document Released: 12/19/2008 Document Revised: 02/26/2013 Document Reviewed: 10/26/2012 [facility] Patient Information 2015 [place], [place]. This information is not intended to replace advice given to you by your health care provider. Make sure you discuss any questions you have with your health care provider.  Call our office for any questions or problems. [phone].   Flexible Bronchoscopy, Care After These instructions give you information on caring for yourself after your procedure. Your doctor may also give you more specific instructions. Call your doctor if you have any problems or questions after your procedure. HOME CARE  Do not eat or drink anything for 2 hours after your procedure. If you try to eat or drink before the medicine wears off, food or drink could go into your lungs.  You could also burn yourself.  After 2 hours have passed and when you can cough and gag normally, you may eat soft food and drink liquids slowly.  The day after the test, you may eat your normal diet.  You may do your normal activities.  Keep all doctor visits. GET HELP RIGHT AWAY IF:  You get more and more short of breath.  You get light-headed.  You feel like you are going to pass out (faint).  You have chest pain.  You have new problems that worry you.  You cough up more than a little blood.  You cough up more blood than before. MAKE SURE YOU:  Understand these instructions.  Will watch your condition.  Will get help right away if you are not doing well or get worse. Document Released: 12/19/2008 Document Revised: 02/26/2013 Document Reviewed: 10/26/2012 [facility] Patient Information 2015 [place], [place]. This information is not intended to replace advice given to you by your health care provider. Make sure you discuss any questions you have with your health care provider.  Conscious Sedation, Adult, Care After Refer to this sheet in the next few weeks. These instructions provide you with information on caring for yourself after your procedure. Your health care provider may also give you more specific instructions. Your treatment has been planned according to current medical practices, but problems sometimes occur. Call your health care provider if you have any problems or questions after your procedure. WHAT TO EXPECT AFTER THE PROCEDURE  After your  procedure:  You may feel sleepy, clumsy, and have poor balance for several hours.  Vomiting may occur if you eat too soon after the procedure. HOME CARE INSTRUCTIONS  Do not participate in any activities where you could become injured for at least 24 hours. Do not:  Drive.  Swim.  Ride a bicycle.  Operate heavy machinery.  Cook.  Use power tools.  Climb ladders.  Work from a high place.  Do not make  important decisions or sign legal documents until you are improved.  If you vomit, drink water, juice, or soup when you can drink without vomiting. Make sure you have little or no nausea before eating solid foods.  Only take over-the-counter or prescription medicines for pain, discomfort, or fever as directed by your health care provider.  Make sure you and your family fully understand everything about the medicines given to you, including what side effects may occur.  You should not drink alcohol, take sleeping pills, or take medicines that cause drowsiness for at least 24 hours.  If you smoke, do not smoke without supervision.  If you are feeling better, you may resume normal activities 24 hours after you were sedated.  Keep all appointments with your health care provider. SEEK MEDICAL CARE IF:  Your skin is pale or bluish in color.  You continue to feel nauseous or vomit.  Your pain is getting worse and is not helped by medicine.  You have bleeding or swelling.  You are still sleepy or feeling clumsy after 24 hours. SEEK IMMEDIATE MEDICAL CARE IF:  You develop a rash.  You have difficulty breathing.  You develop any type of allergic problem.  You have a fever. MAKE SURE YOU:  Understand these instructions.  Will watch your condition.  Will get help right away if you are not doing well or get worse. Document Released: 12/12/2012 Document Reviewed: 12/12/2012 [facility] Patient Information 2015 [place], [place]. This information is not intended to replace advice given to you by your health care provider. Make sure you discuss any questions you have with your health care provider.    1. Follow up with Pulmonary as scheduled  2. Return to the ER immediately if new chest pain, shortness of breath

## 2014-01-21 NOTE — Discharge Summary (Signed)
Physician Discharge Summary  Patient ID: GENA LASKI MRN: [id number] DOB/AGE: [birth year] 68 y.o.  Admit date: 01/20/2014 Discharge date: 01/21/2014    Discharge Diagnoses:  R Pneumothorax - post FOB/EBUS with biopsy Bilateral Pulmonary Infiltrates  Dyspnea R/O Eosinophilic Granulomatosis with Polyangiitis  Neutropenia                                                                       DISCHARGE PLAN BY DIAGNOSIS     R Pneumothorax - post FOB/EBUS with biopsy Bilateral Pulmonary Infiltrates  Dyspnea R/O Eosinophilic Granulomatosis with Polyangiitis   Plan:Discharge Plan: Follow up Pathology results with Dr. Chase Caller Patient instructed to return to ER immediately if sudden chest pain, SOB, massive hemoptysis or change in clinical status Pending results of biopsy, she may need to change her insurance plan. Open enrollment ends Feb 10, 2014.   She would like to establish with a different Rheumatologist if this is a confirmed auto-immune process.   Monitor CBC PRN                  DISCHARGE SUMMARY   MEMORY HEINRICHS is a 68 y.o. y/o female with a PMH of Lupus (diagnosed in the 1990's by Dr. Lara Mulch based on hx of arthralgia, fatigue, and fleeting malar rash), symptoms resolved at that time. She had a second flare of symptoms in 2012 and was seen by Dr. Trudie Reed and again discharged and has done well. In 04/2013 she developed insidious onset of dyspnea on exertion associated with some retrocardiac chest heaviness. This persisted for a few months within resolved but recurred again 3-4 months ago. It is brought on by exertion for walking incline or climbing 2 flights of stairs. Basically class II dyspnea on exertion relieved by rest. She did not notice any dyspnea when we walked her 185 feet 3 laps on room air in the office. She had a negative cardiac stress test with Dr. Einar Gip. However, it was positive for High-grade atherosclerotic stenosis of the left subclavianartery, at the  vertebral ostium. She was evaluated in 12/2103 by Dr. Ashby Dawes with a CXR that was suggestive of ILD. This was followed up with a CTA of the chest on 10/21 which was positive for significant mediastinal adenopathy and scattered bilateral groundglass opacities that is consistent with interstitial lung disease of NON-UIP pattern [does not fit in with IPF disease with honeycombing and bilateral subpleural disease and traction bronchiectasis]. There was also some abdominal lymphadenopathy.   The patient was admitted for FOB / EBUS + biopsy on 11/16 to characterize LAD, B pulmonary infiltrates and dyspnea. 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 Post bronchoscopy CXR was notable for a small R pneumothorax.  Patient was placed on 50% VM for oxygen needs.  Follow up CXR showed resolution of pneumothorax.  Pathology pending 11/17 but medically cleared for discharge with follow up as above.             SIGNIFICANT DIAGNOSTIC STUDIES 11/17 FOB/EBUS with Bx >>    Discharge Exam: General: wdwn adult female in NAD Neuro: AAOx4, speech clear, MAE HEENT: Mm pink/moist, no jvd Cardiovascular: s1s2 rrr, no m/r/g Lungs: resp's even/non-labored, lungs bilaterally  clear, good breath sounds bilaterally  Abdomen: Round/soft, bsx4 active, non-tender to palpation  Musculoskeletal: No acute deformities  Skin: Warm/dry, no edema   Filed Vitals:   01/21/14 0200 01/21/14 0400 01/21/14 0600 01/21/14 0800  BP: 128/65 142/59 133/67   Pulse: 103 94 94   Temp:  98.6 F (37 C)  97.7 F (36.5 C)  TempSrc:  Oral  Oral  Resp: _0 Height:      Weight:  143 lb 1.3 oz (64.9 kg)    SpO2: 92% 92% 96%      Discharge Labs  BMET  Recent Labs Lab 01/20/14 1358 01/21/14 0403  NA 139 140  K 4.2 4.2  CL 104 104  CO2 25 26  GLUCOSE 98 108*  BUN 14 9  CREATININE 0.95 0.90  CALCIUM 8.7  8.8   CBC  Recent Labs Lab 01/20/14 1358 01/21/14 0403  HGB 12.7 13.4  HCT 37.2 39.9  WBC 1.5* 1.5*  PLT 252 249    Discharge Instructions    Call MD for:  difficulty breathing, headache or visual disturbances    Complete by:  As directed      Call MD for:  hives    Complete by:  As directed      Call MD for:  persistant dizziness or light-headedness    Complete by:  As directed      Call MD for:  persistant nausea and vomiting    Complete by:  As directed      Call MD for:  severe uncontrolled pain    Complete by:  As directed      Call MD for:  temperature >100.4    Complete by:  As directed      Diet general    Complete by:  As directed      Discharge instructions    Complete by:  As directed   1.  Follow up with Pulmonary as scheduled 2.  Return to the ER immediately if new chest pain, shortness of breath.     Increase activity slowly    Complete by:  As directed                Follow-up Information    Follow up with PARRETT,TAMMY, NP On 01/28/2014.   Specialty:  Nurse Practitioner   Why:  Appt at 9:00 AM for hospital follow up.   Contact information:   [address] [address] [phone]         Medication List    STOP taking these medications        levofloxacin 500 MG tablet  Commonly known as:  LEVAQUIN      TAKE these medications        acetaminophen 325 MG tablet  Commonly known as:  TYLENOL  Take 650 mg by mouth 2 (two) times daily.     aspirin 81 MG tablet  Take 81 mg by mouth every morning.        Follow-up Information    Follow up with PARRETT,TAMMY, NP On 01/28/2014.   Specialty:  Nurse Practitioner   Why:  Appt at 9:00 AM for hospital follow up.   Contact information:   [address] [address] [phone]         Disposition: Home, no acute home health needs identified prior to discharge.   Discharged Condition: DANYELE SMEJKAL has met maximum benefit of inpatient care and is  medically stable and  cleared for discharge.  Patient is pending follow up as above.      Time spent on disposition:  Greater than 35 minutes.   Signed: Noe Gens, NP-C [facility] Pgr: [phone] Office: [place], [place] 01/21/2014, 11:43 AM Pager:  [phone] After 3pm call: [phone]

## 2014-01-21 NOTE — Progress Notes (Signed)
Pt d/c to home. PIV removed. Discharge paperwork, reasons to return to ED, and follow up appts reviewed with pt. Pt offered no questions. Pt declined wheelchair escort off of unit. Pt escorted off of unit by RN and daughter. Daughter providing ride for pt.

## 2014-01-22 LAB — CULTURE, RESPIRATORY W GRAM STAIN: Culture: NO GROWTH

## 2014-01-22 LAB — CULTURE, RESPIRATORY

## 2014-01-23 NOTE — Telephone Encounter (Signed)
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 DR Lit from path recommending more definitive procioedre. Have her come in 01/27/14 AM as add on to discuss  Thanks  Dr. Brand Males, M.D., [facility].C.P Pulmonary and Critical Care Medicine Staff Physician [place] [facility] Pager: [phone], If no answer or between  15:00h - 7:00h: call [phone]  01/23/2014 11:29 AM

## 2014-01-23 NOTE — Telephone Encounter (Signed)
Called and spoke to pt. Appt made for 01/27/14 at 9:45am with MR. Pt verbalized understanding and denied any further questions or concerns at this time.

## 2014-01-24 NOTE — Progress Notes (Signed)
Quick Note:  Appt made for 01/27/14 with MR. Pt aware of appt. Nothing further needed. ______

## 2014-01-27 ENCOUNTER — Encounter: Payer: Self-pay | Admitting: Internal Medicine

## 2014-01-27 ENCOUNTER — Ambulatory Visit (INDEPENDENT_AMBULATORY_CARE_PROVIDER_SITE_OTHER): Payer: Medicare Other | Admitting: Internal Medicine

## 2014-01-27 VITALS — BP 144/84 | HR 60 | Ht 64.0 in | Wt 137.0 lb

## 2014-01-27 DIAGNOSIS — R590 Localized enlarged lymph nodes: Secondary | ICD-10-CM

## 2014-01-27 DIAGNOSIS — J849 Interstitial pulmonary disease, unspecified: Secondary | ICD-10-CM

## 2014-01-27 DIAGNOSIS — R599 Enlarged lymph nodes, unspecified: Secondary | ICD-10-CM

## 2014-01-27 DIAGNOSIS — D72819 Decreased white blood cell count, unspecified: Secondary | ICD-10-CM | POA: Insufficient documentation

## 2014-01-27 NOTE — Progress Notes (Signed)
Subjective:    Patient ID: [name], female    DOB: Aug 02, 1945, 68 y.o.   MRN: [id number]  HPI    OV 01/27/2014  Chief Complaint  Patient presents with  . Follow-up    Pt here after lung biopsy. Pt c/o cough with little mucus production-beige in color and DOE. Pt denies CP/tightness.     Follow-up endobronchial ultrasound and transbronchial biopsy from 01/20/2014  - The biopsy was complicated by small pneumothorax that spontaneously resolved. Her rheumatologist Dr. Gavin Pound does not think that is autoimmune problem. Top differential diagnosis for her rheumatologist was lymphoma and sarcoid. Patient underwent endobronchial ultrasound guided transbronchial needle aspiration. From station 10 left-sided node all that was seen with small lymphocytes and fragments of granuloma. Station 7 lymph node showed small lymphocytes but no granuloma or malignancy cells or use and refills. Otherwise no atypia or malignancy cells. The CD4 ratio is 1.7:1 from the bronchoalveolar lavage of the right middle lobe. Bronchoalveolar lavage culture showed abundant histiocytes but no atypia or malignancy cells. Transbronchial biopsy right middle lobe showed reactive bronchial epithelium with underlying lympho-plasmacytoid taken histiocytic inflammation but no evidence of eosinophils atypia or malignancy.  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 Now at 1.5K - low GFR with normal creat - GFR 60s but creat 0.9mg %  Review of Systems  Constitutional: Negative for fever and unexpected weight change.  HENT: Negative for congestion, dental problem, ear pain, nosebleeds, postnasal drip, rhinorrhea, sinus pressure, sneezing, sore throat and trouble swallowing.   Eyes: Negative for redness and itching.  Respiratory: Positive for cough and shortness of breath. Negative for chest tightness and  wheezing.   Cardiovascular: Negative for palpitations and leg swelling.  Gastrointestinal: Negative for nausea and vomiting.  Genitourinary: Negative for dysuria.  Musculoskeletal: Negative for joint swelling.  Skin: Negative for rash.  Neurological: Negative for headaches.  Hematological: Does not bruise/bleed easily.  Psychiatric/Behavioral: Negative for dysphoric mood. The patient is not nervous/anxious.        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 Brand Males, M.D., [facility].C.P Pulmonary and Critical Care Medicine Staff Physician [facility] Pager: [phone], If no answer or between  15:00h - 7:00h: call [phone]  01/28/2014 6:19 PM   Future Appointments Date Time Provider [place]  02/03/2014 3:00 PM Melrose Nakayama, MD [facility]  02/11/2014 9:45 AM [facility] FINANCIAL COUNSELOR [facility] None  02/11/2014 10:15 AM Heath Lark, MD [facility] None  02/24/2014 9:30 AM Tammy Jeralene Huff, NP [facility] None

## 2014-01-27 NOTE — Patient Instructions (Signed)
#  MEdiastinal granuloma and ILD  - refer to Dr Servando Snare or [place] for mediastinascopy and VATS Surgical lung biopsy  #Leukopenia  - refer Dr Heath Lark for opinion  #Followup  4 weeks to see me or my NP Tammy

## 2014-01-28 ENCOUNTER — Telehealth: Payer: Self-pay | Admitting: Hematology and Oncology

## 2014-01-28 ENCOUNTER — Inpatient Hospital Stay: Payer: Medicare Other | Admitting: Adult Health

## 2014-01-28 NOTE — Telephone Encounter (Signed)
S/W PATIENT AND GAVE NP APPT FOR 12/08 @ 9:45 W/DR. Time.

## 2014-02-03 ENCOUNTER — Other Ambulatory Visit: Payer: Self-pay | Admitting: *Deleted

## 2014-02-03 ENCOUNTER — Institutional Professional Consult (permissible substitution) (INDEPENDENT_AMBULATORY_CARE_PROVIDER_SITE_OTHER): Payer: Medicare Other | Admitting: Thoracic Surgery (Cardiothoracic Vascular Surgery)

## 2014-02-03 ENCOUNTER — Encounter: Payer: Self-pay | Admitting: Thoracic Surgery (Cardiothoracic Vascular Surgery)

## 2014-02-03 VITALS — BP 139/88 | HR 104 | Resp 20 | Ht 64.0 in | Wt 133.0 lb

## 2014-02-03 DIAGNOSIS — R590 Localized enlarged lymph nodes: Secondary | ICD-10-CM

## 2014-02-03 DIAGNOSIS — J849 Interstitial pulmonary disease, unspecified: Secondary | ICD-10-CM

## 2014-02-03 DIAGNOSIS — R918 Other nonspecific abnormal finding of lung field: Secondary | ICD-10-CM

## 2014-02-03 DIAGNOSIS — R591 Generalized enlarged lymph nodes: Secondary | ICD-10-CM

## 2014-02-03 DIAGNOSIS — R599 Enlarged lymph nodes, unspecified: Secondary | ICD-10-CM

## 2014-02-03 NOTE — Progress Notes (Signed)
PCP is Merrilee Seashore, MD Referring Provider is Brand Males, MD  Chief Complaint  Patient presents with  . Interstitial Lung Disease    Surgical eval, Chest CT 12/25/13    HPI: 68 year old woman with a chief complaint of shortness of breath with exertion.  [name] is a 68 year old woman with an enigmatic medical history. She says that back in 1979 around the time of her pregnancy she was having a great deal difficulty with joint pain. That resolved and she really didn't have any other issues until around 1990 when she started having joint pain again. She was diagnosed with systemic lupus based on arthralgias and a malar rash and was treated with nonsteroidal anti-inflammatories. She was never treated with any other medications for that. She eventually stopped following up related to that and says that her blood work was consistent with lupus.  She was doing well until February of this year when she started having shortness of breath. This lasted for about 4-6 weeks but ultimately improved and she did not seek any treatment. This past summer she began having shortness of breath with exertion again. This time however rather than improving it continued to worsen. She was having some pleuritic left-sided chest pain and they'll likely lead to her seeking medical attention. She was treated with antibiotics but did not improve. She ultimately had a CT scan which showed bilateral pulmonary infiltrates and marked hilar and mediastinal adenopathy.  She had a bronchoscopy and endobronchial ultrasound on November 16. Results were indeterminate. There were fragments of a granuloma noted in one lymph node. Bronchoalveolar lavage showed abundant histiocytes but no atypia or malignant cells. Transbronchial biopsy of the right middle lobe showed lympho-plasmacytoid histiocytic inflammation. Although suspicious for sarcoidosis of the results were felt to be nondiagnostic. She is now referred for surgical  biopsy.   Past Medical History  Diagnosis Date  . Dyspnea     increase with exertion  . Headache     "aura" headaches less frequent    Past Surgical History  Procedure Laterality Date  . Tonsillectomy    . Uterine fibroid surgery    . Endobronchial ultrasound Bilateral 01/20/2014    Procedure: ENDOBRONCHIAL ULTRASOUND;  Surgeon: Collene Gobble, MD;  Location: [facility];  Service: Cardiopulmonary;  Laterality: Bilateral;    Family History  Problem Relation Age of Onset  . Heart disease Mother   . Heart disease Father   . Emphysema Father   . Diabetes Mellitus II Mother     Social History History  Substance Use Topics  . Smoking status: Never Smoker   . Smokeless tobacco: Never Used  . Alcohol Use: Yes     Comment: rarely social    Current Outpatient Prescriptions  Medication Sig Dispense Refill  . aspirin 81 MG tablet Take 81 mg by mouth every morning.      No current facility-administered medications for this visit.    No Known Allergies  Review of Systems  Constitutional: Positive for activity change, appetite change and unexpected weight change (lost 7 pounds in 3 months). Negative for fever, chills and diaphoresis.  Respiratory: Positive for cough (nonproductive) and shortness of breath.   Cardiovascular: Positive for chest pain (Left sided lateral and sub-scapular pain a few months ago. None currently).  Musculoskeletal: Positive for arthralgias (None for several years).       Leg cramps  All other systems reviewed and are negative.   BP 139/88 mmHg  Pulse 104  Resp 20  Ht 5\' 4"  (  1.626 m)  Wt 133 lb (60.328 kg)  BMI 22.82 kg/m2  SpO2 93% Physical Exam  Constitutional: She is oriented to person, place, and time. She appears well-developed and well-nourished. No distress.  HENT:  Head: Normocephalic and atraumatic.  Eyes: EOM are normal. Pupils are equal, round, and reactive to light.  Neck: Neck supple. No thyromegaly present.  Cardiovascular:  Normal rate, regular rhythm and normal heart sounds.   Pulmonary/Chest: She has no wheezes. She has rales (bibasilar).  Abdominal: Soft. There is no tenderness.  Musculoskeletal: She exhibits no edema.  Lymphadenopathy:    She has no cervical adenopathy.  Neurological: She is alert and oriented to person, place, and time. No cranial nerve deficit.  Skin: Skin is warm and dry.  Vitals reviewed.    Diagnostic Tests: CT CHEST WITH CONTRAST 12/25/2013  TECHNIQUE: Multidetector CT imaging of the chest was performed during intravenous contrast administration.  CONTRAST: 37mL OMNIPAQUE IOHEXOL 300 MG/ML SOLN  COMPARISON: None.  FINDINGS: THORACIC INLET/BODY WALL:  No acute abnormality.  MEDIASTINUM:  There is no cardiomegaly. No pericardial effusion. Atherosclerosis, including the markedly narrowed left subclavian artery near the vertebral artery origin. The left vertebral artery is grossly patent.  There is confluent masses in the bilateral hila, infiltrating the middle mediastinum and partially encasing the descending thoracic aorta. There is subpleural and pleural extension in the left posterior chest. Irregular-shaped limits reproducible size measurement, but for estimation purposes, left hilar mass measures greater than 4 cm. The mass results in narrowing and distortion of the pulmonary arterial tree diffusely. There is no central pulmonary filling defect identified. No acute aortic findings.  LUNG WINDOWS:  Patchy bilateral lung opacities, present in all lobes. There is a basilar predominance. These have ground-glass predominantly, although there is also dense consolidation with air bronchograms. Centrally the nodules are denser. Small left pleural effusion.  UPPER ABDOMEN:  11 mm and 14 mm short axis adenopathy around left diaphragm crus.  OSSEOUS:  There is a lucency within the posterior T12 body which appears geographic and low-density,  favoring a hemangioma over metastatic disease.  These results will be called to the ordering clinician or representative by the Radiologist Assistant, and communication documented in the PACS or zVision Dashboard.  IMPRESSION: 1. Diffuse malignant-appearing adenopathy in the pulmonary hila and mediastinum. There is vessel encasement, left pleural involvement, and upper abdominal adenopathy. The appearance favors lymphoma. 2. Diffuse bilateral airspace disease which could reflect a atypical/opportunistic infection or metastatic disease. 3. T12 vertebral body lucency, favor hemangioma. Attention on follow-up. 4. 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   Electronically Signed  By: Jorje Guild M.D.  On: 12/25/2013 17:00  Impression: 68 year old woman with bilateral interstitial lung disease and hilar and mediastinal adenopathy. She needs a surgical biopsy of both for definitive diagnosis. Most likely this represents a common process, but it also is possible that there are 2 separate processes going on such as ILD and lymphoma.  I discussed the proposed operation with [name]. We will plan to do a left video-assisted thoracoscopy for lung biopsy and lymph node biopsy. I described the general nature of the operation to her including the need for general anesthesia, the incisions be used, the use of a chest tube postoperatively, the expected hospital stay, and the overall recovery. She does understand that she cannot go home alone after the procedure and will need assistance. I informed her of the indications, risks, benefits, and alternatives. She understands the risk include,  but are not limited to death, MI, DVT, PE, bleeding, possible need for transfusion, infection, prolonged air leak, cardiac arrhythmias, as well as the possibility of unforeseeable, occasions.  She understands and accepts the risks and wishes to  proceed.  Plan: Left video-assisted thoracoscopy for lung biopsy and lymph node biopsy on Thursday, December 2.

## 2014-02-05 ENCOUNTER — Encounter (HOSPITAL_COMMUNITY): Payer: Self-pay | Admitting: Pharmacy Technician

## 2014-02-05 ENCOUNTER — Encounter (HOSPITAL_COMMUNITY): Payer: Self-pay | Admitting: *Deleted

## 2014-02-05 MED ORDER — DEXTROSE 5 % IV SOLN
1.5000 g | INTRAVENOUS | Status: AC
  Administered 2014-02-06: 1.5 g via INTRAVENOUS
  Filled 2014-02-05: qty 1.5

## 2014-02-05 NOTE — Progress Notes (Signed)
Pt states she had a stress test in November by Dr. Einar Gip. Called Dr. Irven Shelling office and requested EKG and Stress test be faxed to [place]. Pt states she was told it was normal.

## 2014-02-06 ENCOUNTER — Encounter (HOSPITAL_COMMUNITY)
Admission: RE | Disposition: A | Discharge status: Home / Self Care | Payer: Self-pay | Source: Ambulatory Visit | Attending: Thoracic Surgery (Cardiothoracic Vascular Surgery)

## 2014-02-06 ENCOUNTER — Inpatient Hospital Stay (HOSPITAL_COMMUNITY): Payer: Medicare Other | Admitting: Anesthesiology

## 2014-02-06 ENCOUNTER — Inpatient Hospital Stay (HOSPITAL_COMMUNITY)
Admission: RE | Admit: 2014-02-06 | Discharge: 2014-02-09 | DRG: 824 | Disposition: A | Discharge status: Home / Self Care | Payer: Medicare Other | Source: Ambulatory Visit | Attending: Thoracic Surgery (Cardiothoracic Vascular Surgery) | Admitting: Thoracic Surgery (Cardiothoracic Vascular Surgery)

## 2014-02-06 ENCOUNTER — Inpatient Hospital Stay (HOSPITAL_COMMUNITY): Payer: Medicare Other

## 2014-02-06 ENCOUNTER — Encounter (HOSPITAL_COMMUNITY): Payer: Self-pay | Admitting: Anesthesiology

## 2014-02-06 DIAGNOSIS — Z7982 Long term (current) use of aspirin: Secondary | ICD-10-CM | POA: Diagnosis not present

## 2014-02-06 DIAGNOSIS — J849 Interstitial pulmonary disease, unspecified: Secondary | ICD-10-CM | POA: Diagnosis present

## 2014-02-06 DIAGNOSIS — R918 Other nonspecific abnormal finding of lung field: Secondary | ICD-10-CM

## 2014-02-06 DIAGNOSIS — R591 Generalized enlarged lymph nodes: Secondary | ICD-10-CM

## 2014-02-06 DIAGNOSIS — R0602 Shortness of breath: Secondary | ICD-10-CM | POA: Diagnosis present

## 2014-02-06 DIAGNOSIS — C8519 Unspecified B-cell lymphoma, extranodal and solid organ sites: Secondary | ICD-10-CM | POA: Diagnosis present

## 2014-02-06 DIAGNOSIS — I771 Stricture of artery: Secondary | ICD-10-CM | POA: Diagnosis present

## 2014-02-06 DIAGNOSIS — M2550 Pain in unspecified joint: Secondary | ICD-10-CM | POA: Diagnosis present

## 2014-02-06 DIAGNOSIS — D1809 Hemangioma of other sites: Secondary | ICD-10-CM | POA: Diagnosis present

## 2014-02-06 DIAGNOSIS — R42 Dizziness and giddiness: Secondary | ICD-10-CM | POA: Diagnosis not present

## 2014-02-06 DIAGNOSIS — R590 Localized enlarged lymph nodes: Secondary | ICD-10-CM

## 2014-02-06 DIAGNOSIS — Z9889 Other specified postprocedural states: Secondary | ICD-10-CM

## 2014-02-06 DIAGNOSIS — R599 Enlarged lymph nodes, unspecified: Secondary | ICD-10-CM

## 2014-02-06 HISTORY — PX: VIDEO ASSISTED THORACOSCOPY: SHX5073

## 2014-02-06 HISTORY — DX: Pain in unspecified joint: M25.50

## 2014-02-06 HISTORY — DX: Pneumonia, unspecified organism: J18.9

## 2014-02-06 HISTORY — PX: LYMPH NODE BIOPSY: SHX201

## 2014-02-06 HISTORY — PX: LUNG BIOPSY: SHX5088

## 2014-02-06 HISTORY — DX: Other chronic pain: G89.29

## 2014-02-06 HISTORY — DX: Tachycardia, unspecified: R00.0

## 2014-02-06 LAB — BLOOD GAS, ARTERIAL
Acid-base deficit: 2.2 mmol/L — ABNORMAL HIGH (ref 0.0–2.0)
Bicarbonate: 20.9 mEq/L (ref 20.0–24.0)
DRAWN BY: 206361
FIO2: 0.21 %
O2 Saturation: 90 %
PCO2 ART: 29.2 mmHg — AB (ref 35.0–45.0)
Patient temperature: 98.6
TCO2: 21.8 mmol/L (ref 0–100)
pH, Arterial: 7.468 — ABNORMAL HIGH (ref 7.350–7.450)
pO2, Arterial: 54.5 mmHg — ABNORMAL LOW (ref 80.0–100.0)

## 2014-02-06 LAB — COMPREHENSIVE METABOLIC PANEL
ALBUMIN: 3 g/dL — AB (ref 3.5–5.2)
ALK PHOS: 50 U/L (ref 39–117)
ALT: 14 U/L (ref 0–35)
AST: 20 U/L (ref 0–37)
Anion gap: 14 (ref 5–15)
BILIRUBIN TOTAL: 0.5 mg/dL (ref 0.3–1.2)
BUN: 12 mg/dL (ref 6–23)
CHLORIDE: 103 meq/L (ref 96–112)
CO2: 18 mEq/L — ABNORMAL LOW (ref 19–32)
Calcium: 8.9 mg/dL (ref 8.4–10.5)
Creatinine, Ser: 0.92 mg/dL (ref 0.50–1.10)
GFR calc Af Amer: 73 mL/min — ABNORMAL LOW (ref >90)
GFR calc non Af Amer: 63 mL/min — ABNORMAL LOW (ref >90)
Glucose, Bld: 95 mg/dL (ref 70–99)
Potassium: 4.3 mEq/L (ref 3.7–5.3)
Sodium: 135 mEq/L — ABNORMAL LOW (ref 137–147)
Total Protein: 7.2 g/dL (ref 6.0–8.3)

## 2014-02-06 LAB — CBC
HCT: 41.9 % (ref 36.0–46.0)
Hemoglobin: 14.4 g/dL (ref 12.0–15.0)
MCH: 28.3 pg (ref 26.0–34.0)
MCHC: 34.4 g/dL (ref 30.0–36.0)
MCV: 82.5 fL (ref 78.0–100.0)
PLATELETS: 315 10*3/uL (ref 150–400)
RBC: 5.08 MIL/uL (ref 3.87–5.11)
RDW: 14.4 % (ref 11.5–15.5)
WBC: 2.6 10*3/uL — AB (ref 4.0–10.5)

## 2014-02-06 LAB — APTT: aPTT: 28 seconds (ref 24–37)

## 2014-02-06 LAB — SURGICAL PCR SCREEN
MRSA, PCR: NEGATIVE
STAPHYLOCOCCUS AUREUS: NEGATIVE

## 2014-02-06 LAB — PROTIME-INR
INR: 1.16 (ref 0.00–1.49)
Prothrombin Time: 15 seconds (ref 11.6–15.2)

## 2014-02-06 SURGERY — VIDEO ASSISTED THORACOSCOPY
Anesthesia: General | Site: Chest

## 2014-02-06 MED ORDER — OXYCODONE HCL 5 MG PO TABS
ORAL_TABLET | ORAL | Status: AC
  Filled 2014-02-06: qty 1

## 2014-02-06 MED ORDER — INFLUENZA VAC SPLIT QUAD 0.5 ML IM SUSY: 0.5000 mL | PREFILLED_SYRINGE | INTRAMUSCULAR | Status: DC

## 2014-02-06 MED ORDER — FENTANYL CITRATE 0.05 MG/ML IJ SOLN
INTRAMUSCULAR | Status: DC | PRN
  Administered 2014-02-06 (×2): 100 ug via INTRAVENOUS
  Administered 2014-02-06: 50 ug via INTRAVENOUS

## 2014-02-06 MED ORDER — PHENYLEPHRINE HCL 10 MG/ML IJ SOLN
10.0000 mg | INTRAVENOUS | Status: DC | PRN
  Administered 2014-02-06: 20 ug/min via INTRAVENOUS

## 2014-02-06 MED ORDER — EPHEDRINE SULFATE 50 MG/ML IJ SOLN
INTRAMUSCULAR | Status: DC | PRN
  Administered 2014-02-06 (×2): 15 mg via INTRAVENOUS

## 2014-02-06 MED ORDER — POTASSIUM CHLORIDE 10 MEQ/50ML IV SOLN: 10.0000 meq | Freq: Every day | INTRAVENOUS | Status: DC | PRN

## 2014-02-06 MED ORDER — BUPIVACAINE 0.5 % ON-Q PUMP SINGLE CATH 400 ML
INJECTION | Status: DC | PRN
Start: 2014-02-06 — End: 2014-02-06
  Administered 2014-02-06: 400 mL

## 2014-02-06 MED ORDER — TRAMADOL HCL 50 MG PO TABS: 50.0000 mg | ORAL_TABLET | Freq: Four times a day (QID) | ORAL | Status: DC | PRN

## 2014-02-06 MED ORDER — MUPIROCIN 2 % EX OINT
TOPICAL_OINTMENT | CUTANEOUS | Status: AC
  Administered 2014-02-06: 1 via TOPICAL
  Filled 2014-02-06: qty 22

## 2014-02-06 MED ORDER — 0.9 % SODIUM CHLORIDE (POUR BTL) OPTIME
TOPICAL | Status: DC | PRN
  Administered 2014-02-06: 2000 mL

## 2014-02-06 MED ORDER — FENTANYL 10 MCG/ML IV SOLN
INTRAVENOUS | Status: DC
  Administered 2014-02-06: 30 ug via INTRAVENOUS
  Administered 2014-02-06: 13:00:00 via INTRAVENOUS
  Administered 2014-02-06: 10 ug via INTRAVENOUS
  Administered 2014-02-06: 130 ug via INTRAVENOUS
  Administered 2014-02-07: 40 ug via INTRAVENOUS
  Administered 2014-02-07 – 2014-02-08 (×2): 20 ug via INTRAVENOUS
  Filled 2014-02-06: qty 50

## 2014-02-06 MED ORDER — ACETAMINOPHEN 160 MG/5ML PO SOLN
1000.0000 mg | Freq: Four times a day (QID) | ORAL | Status: DC
  Filled 2014-02-06 (×15): qty 40

## 2014-02-06 MED ORDER — FENTANYL CITRATE 0.05 MG/ML IJ SOLN
INTRAMUSCULAR | Status: AC
  Filled 2014-02-06: qty 5

## 2014-02-06 MED ORDER — DIPHENHYDRAMINE HCL 50 MG/ML IJ SOLN
12.5000 mg | Freq: Four times a day (QID) | INTRAMUSCULAR | Status: DC | PRN
  Filled 2014-02-06: qty 0.25

## 2014-02-06 MED ORDER — KCL IN DEXTROSE-NACL 20-5-0.45 MEQ/L-%-% IV SOLN
INTRAVENOUS | Status: DC
  Administered 2014-02-06 (×2): via INTRAVENOUS
  Filled 2014-02-06 (×8): qty 1000

## 2014-02-06 MED ORDER — ONDANSETRON HCL 4 MG/2ML IJ SOLN: 4.0000 mg | Freq: Once | INTRAMUSCULAR | Status: DC | PRN

## 2014-02-06 MED ORDER — ROCURONIUM BROMIDE 100 MG/10ML IV SOLN
INTRAVENOUS | Status: DC | PRN
  Administered 2014-02-06: 40 mg via INTRAVENOUS

## 2014-02-06 MED ORDER — NEOSTIGMINE METHYLSULFATE 10 MG/10ML IV SOLN
INTRAVENOUS | Status: AC
  Filled 2014-02-06: qty 1

## 2014-02-06 MED ORDER — INFLUENZA VAC SPLIT QUAD 0.5 ML IM SUSY
0.5000 mL | PREFILLED_SYRINGE | INTRAMUSCULAR | Status: AC | PRN
  Administered 2014-02-09: 0.5 mL via INTRAMUSCULAR
  Filled 2014-02-06 (×2): qty 0.5

## 2014-02-06 MED ORDER — NALOXONE HCL 0.4 MG/ML IJ SOLN
0.4000 mg | INTRAMUSCULAR | Status: DC | PRN
  Filled 2014-02-06: qty 1

## 2014-02-06 MED ORDER — SODIUM CHLORIDE 0.9 % IJ SOLN: 9.0000 mL | INTRAMUSCULAR | Status: DC | PRN

## 2014-02-06 MED ORDER — BUPIVACAINE ON-Q PAIN PUMP (FOR ORDER SET NO CHG)
INJECTION | Status: DC
  Filled 2014-02-06: qty 1

## 2014-02-06 MED ORDER — GLYCOPYRROLATE 0.2 MG/ML IJ SOLN
INTRAMUSCULAR | Status: DC | PRN
  Administered 2014-02-06: .4 mg via INTRAVENOUS

## 2014-02-06 MED ORDER — MUPIROCIN 2 % EX OINT
1.0000 "application " | TOPICAL_OINTMENT | Freq: Once | CUTANEOUS | Status: AC
  Administered 2014-02-06: 1 via TOPICAL

## 2014-02-06 MED ORDER — BISACODYL 5 MG PO TBEC
10.0000 mg | DELAYED_RELEASE_TABLET | Freq: Every day | ORAL | Status: DC
  Administered 2014-02-06 – 2014-02-08 (×3): 10 mg via ORAL
  Filled 2014-02-06 (×4): qty 2

## 2014-02-06 MED ORDER — BUPIVACAINE HCL (PF) 0.5 % IJ SOLN
INTRAMUSCULAR | Status: DC | PRN
  Administered 2014-02-06: 5 mL

## 2014-02-06 MED ORDER — OXYCODONE HCL 5 MG PO TABS
5.0000 mg | ORAL_TABLET | ORAL | Status: DC | PRN
  Administered 2014-02-06: 5 mg via ORAL

## 2014-02-06 MED ORDER — PNEUMOCOCCAL VAC POLYVALENT 25 MCG/0.5ML IJ INJ: 0.5000 mL | INJECTION | INTRAMUSCULAR | Status: DC

## 2014-02-06 MED ORDER — ONDANSETRON HCL 4 MG/2ML IJ SOLN
INTRAMUSCULAR | Status: DC | PRN
  Administered 2014-02-06: 4 mg via INTRAVENOUS

## 2014-02-06 MED ORDER — PNEUMOCOCCAL VAC POLYVALENT 25 MCG/0.5ML IJ INJ: 0.5000 mL | INJECTION | INTRAMUSCULAR | Status: DC | PRN

## 2014-02-06 MED ORDER — LACTATED RINGERS IV SOLN
INTRAVENOUS | Status: DC
  Administered 2014-02-06 (×2): via INTRAVENOUS

## 2014-02-06 MED ORDER — PHENYLEPHRINE HCL 10 MG/ML IJ SOLN
INTRAMUSCULAR | Status: DC | PRN
  Administered 2014-02-06 (×2): 80 ug via INTRAVENOUS
  Administered 2014-02-06 (×2): 40 ug via INTRAVENOUS
  Administered 2014-02-06: 80 ug via INTRAVENOUS

## 2014-02-06 MED ORDER — DEXTROSE 5 % IV SOLN
1.5000 g | Freq: Two times a day (BID) | INTRAVENOUS | Status: AC
  Administered 2014-02-06 – 2014-02-07 (×2): 1.5 g via INTRAVENOUS
  Filled 2014-02-06 (×2): qty 1.5

## 2014-02-06 MED ORDER — GLYCOPYRROLATE 0.2 MG/ML IJ SOLN
INTRAMUSCULAR | Status: AC
  Filled 2014-02-06: qty 2

## 2014-02-06 MED ORDER — ACETAMINOPHEN 500 MG PO TABS
1000.0000 mg | ORAL_TABLET | Freq: Four times a day (QID) | ORAL | Status: DC
  Administered 2014-02-06 – 2014-02-09 (×11): 1000 mg via ORAL
  Filled 2014-02-06 (×15): qty 2

## 2014-02-06 MED ORDER — MIDAZOLAM HCL 2 MG/2ML IJ SOLN
INTRAMUSCULAR | Status: AC
Start: 2014-02-06 — End: 2014-02-06
  Filled 2014-02-06: qty 2

## 2014-02-06 MED ORDER — ASPIRIN 81 MG PO CHEW
81.0000 mg | CHEWABLE_TABLET | Freq: Every morning | ORAL | Status: DC
  Administered 2014-02-07 – 2014-02-09 (×3): 81 mg via ORAL
  Filled 2014-02-06 (×3): qty 1

## 2014-02-06 MED ORDER — BUPIVACAINE 0.5 % ON-Q PUMP SINGLE CATH 400 ML
400.0000 mL | INJECTION | Status: DC
  Filled 2014-02-06: qty 400

## 2014-02-06 MED ORDER — ONDANSETRON HCL 4 MG/2ML IJ SOLN
4.0000 mg | Freq: Four times a day (QID) | INTRAMUSCULAR | Status: DC | PRN
  Administered 2014-02-07: 4 mg via INTRAVENOUS
  Filled 2014-02-06 (×2): qty 2

## 2014-02-06 MED ORDER — FENTANYL CITRATE 0.05 MG/ML IJ SOLN
INTRAMUSCULAR | Status: AC
  Filled 2014-02-06: qty 2

## 2014-02-06 MED ORDER — DIPHENHYDRAMINE HCL 12.5 MG/5ML PO ELIX
12.5000 mg | ORAL_SOLUTION | Freq: Four times a day (QID) | ORAL | Status: DC | PRN
  Filled 2014-02-06: qty 5

## 2014-02-06 MED ORDER — NEOSTIGMINE METHYLSULFATE 10 MG/10ML IV SOLN
INTRAVENOUS | Status: DC | PRN
  Administered 2014-02-06: 3 mg via INTRAVENOUS

## 2014-02-06 MED ORDER — PROPOFOL 10 MG/ML IV BOLUS
INTRAVENOUS | Status: DC | PRN
  Administered 2014-02-06: 150 mg via INTRAVENOUS

## 2014-02-06 MED ORDER — ONDANSETRON HCL 4 MG/2ML IJ SOLN: 4.0000 mg | Freq: Four times a day (QID) | INTRAMUSCULAR | Status: DC | PRN

## 2014-02-06 MED ORDER — HYDROMORPHONE HCL 1 MG/ML IJ SOLN: 0.2500 mg | INTRAMUSCULAR | Status: DC | PRN

## 2014-02-06 MED ORDER — KCL IN DEXTROSE-NACL 20-5-0.45 MEQ/L-%-% IV SOLN
INTRAVENOUS | Status: AC
  Filled 2014-02-06: qty 1000

## 2014-02-06 MED ORDER — BUPIVACAINE HCL (PF) 0.5 % IJ SOLN
INTRAMUSCULAR | Status: AC
  Filled 2014-02-06: qty 10

## 2014-02-06 MED ORDER — SENNOSIDES-DOCUSATE SODIUM 8.6-50 MG PO TABS
1.0000 | ORAL_TABLET | Freq: Every day | ORAL | Status: DC
  Administered 2014-02-07: 1 via ORAL
  Filled 2014-02-06 (×4): qty 1

## 2014-02-06 SURGICAL SUPPLY — 87 items
ADH SKN CLS APL DERMABOND .7 (GAUZE/BANDAGES/DRESSINGS)
ADH SKN CLS LQ APL DERMABOND (GAUZE/BANDAGES/DRESSINGS) ×2
APPLIER CLIP ROT 10 11.4 M/L (STAPLE)
APR CLP MED LRG 11.4X10 (STAPLE)
BAG SPEC RTRVL LRG 6X4 10 (ENDOMECHANICALS)
CANISTER SUCTION 2500CC (MISCELLANEOUS) ×4 IMPLANT
CATH KIT ON Q 5IN SLV (PAIN MANAGEMENT) IMPLANT
CATH THORACIC 28FR (CATHETERS) ×2 IMPLANT
CATH THORACIC 28FR RT ANG (CATHETERS) IMPLANT
CATH THORACIC 36FR (CATHETERS) IMPLANT
CATH THORACIC 36FR RT ANG (CATHETERS) IMPLANT
CLIP APPLIE ROT 10 11.4 M/L (STAPLE) IMPLANT
CLIP TI MEDIUM 6 (CLIP) ×2 IMPLANT
CONN Y 3/8X3/8X3/8  BEN (MISCELLANEOUS) ×2
CONN Y 3/8X3/8X3/8 BEN (MISCELLANEOUS) ×2 IMPLANT
CONT SPEC 4OZ CLIKSEAL STRL BL (MISCELLANEOUS) ×14 IMPLANT
COVER SURGICAL LIGHT HANDLE (MISCELLANEOUS) ×4 IMPLANT
DECANTER SPIKE VIAL GLASS SM (MISCELLANEOUS) ×2 IMPLANT
DERMABOND ADHESIVE PROPEN (GAUZE/BANDAGES/DRESSINGS) ×2
DERMABOND ADVANCED (GAUZE/BANDAGES/DRESSINGS)
DERMABOND ADVANCED .7 DNX12 (GAUZE/BANDAGES/DRESSINGS) IMPLANT
DERMABOND ADVANCED .7 DNX6 (GAUZE/BANDAGES/DRESSINGS) IMPLANT
DRAPE LAPAROSCOPIC ABDOMINAL (DRAPES) ×4 IMPLANT
DRAPE WARM FLUID 44X44 (DRAPE) ×2 IMPLANT
ELECT BLADE 6.5 EXT (BLADE) ×3 IMPLANT
ELECT REM PT RETURN 9FT ADLT (ELECTROSURGICAL) ×4
ELECTRODE REM PT RTRN 9FT ADLT (ELECTROSURGICAL) ×2 IMPLANT
GAUZE SPONGE 4X4 12PLY STRL (GAUZE/BANDAGES/DRESSINGS) ×4 IMPLANT
GLOVE BIOGEL PI IND STRL 6.5 (GLOVE) IMPLANT
GLOVE BIOGEL PI INDICATOR 6.5 (GLOVE) ×6
GLOVE SURG SIGNA 7.5 PF LTX (GLOVE) ×8 IMPLANT
GLOVE SURG SS PI 7.0 STRL IVOR (GLOVE) ×4 IMPLANT
GOWN STRL REUS W/ TWL LRG LVL3 (GOWN DISPOSABLE) ×4 IMPLANT
GOWN STRL REUS W/ TWL XL LVL3 (GOWN DISPOSABLE) ×2 IMPLANT
GOWN STRL REUS W/TWL LRG LVL3 (GOWN DISPOSABLE) ×8
GOWN STRL REUS W/TWL XL LVL3 (GOWN DISPOSABLE) ×4
HANDLE STAPLE ENDO GIA SHORT (STAPLE) ×2
HEMOSTAT SURGICEL 2X14 (HEMOSTASIS) IMPLANT
KIT BASIN OR (CUSTOM PROCEDURE TRAY) ×4 IMPLANT
KIT ROOM TURNOVER OR (KITS) ×4 IMPLANT
KIT SUCTION CATH 14FR (SUCTIONS) ×4 IMPLANT
NS IRRIG 1000ML POUR BTL (IV SOLUTION) ×8 IMPLANT
PACK CHEST (CUSTOM PROCEDURE TRAY) ×4 IMPLANT
PAD ARMBOARD 7.5X6 YLW CONV (MISCELLANEOUS) ×8 IMPLANT
POUCH ENDO CATCH II 15MM (MISCELLANEOUS) IMPLANT
POUCH SPECIMEN RETRIEVAL 10MM (ENDOMECHANICALS) IMPLANT
RELOAD EGIA 45 MED/THCK PURPLE (STAPLE) ×4 IMPLANT
RELOAD EGIA 60 MED/THCK PURPLE (STAPLE) ×16 IMPLANT
RELOAD STAPLE 60 MED/THCK ART (STAPLE) IMPLANT
SEALANT PROGEL (MISCELLANEOUS) ×2 IMPLANT
SEALANT SURG COSEAL 4ML (VASCULAR PRODUCTS) IMPLANT
SEALANT SURG COSEAL 8ML (VASCULAR PRODUCTS) IMPLANT
SOLUTION ANTI FOG 6CC (MISCELLANEOUS) ×4 IMPLANT
SPECIMEN JAR MEDIUM (MISCELLANEOUS) ×4 IMPLANT
SPONGE GAUZE 4X4 12PLY STER LF (GAUZE/BANDAGES/DRESSINGS) ×2 IMPLANT
SPONGE INTESTINAL PEANUT (DISPOSABLE) IMPLANT
STAPLER ENDO GIA 12 SHRT THIN (STAPLE) IMPLANT
STAPLER ENDO GIA 12MM SHORT (STAPLE) ×2 IMPLANT
SUT PROLENE 4 0 RB 1 (SUTURE)
SUT PROLENE 4-0 RB1 .5 CRCL 36 (SUTURE) IMPLANT
SUT SILK  1 MH (SUTURE) ×4
SUT SILK 1 MH (SUTURE) ×4 IMPLANT
SUT SILK 2 0SH CR/8 30 (SUTURE) IMPLANT
SUT SILK 3 0SH CR/8 30 (SUTURE) ×2 IMPLANT
SUT VIC AB 1 CTX 36 (SUTURE) ×4
SUT VIC AB 1 CTX36XBRD ANBCTR (SUTURE) IMPLANT
SUT VIC AB 2-0 CTX 36 (SUTURE) ×3 IMPLANT
SUT VIC AB 2-0 UR6 27 (SUTURE) IMPLANT
SUT VIC AB 3-0 MH 27 (SUTURE) IMPLANT
SUT VIC AB 3-0 SH 27 (SUTURE) ×4
SUT VIC AB 3-0 SH 27X BRD (SUTURE) ×1 IMPLANT
SUT VIC AB 3-0 X1 27 (SUTURE) ×4 IMPLANT
SUT VICRYL 2 TP 1 (SUTURE) IMPLANT
SWAB COLLECTION DEVICE MRSA (MISCELLANEOUS) IMPLANT
SYR 5ML LL (SYRINGE) ×2 IMPLANT
SYSTEM SAHARA CHEST DRAIN ATS (WOUND CARE) ×4 IMPLANT
TAPE CLOTH 4X10 WHT NS (GAUZE/BANDAGES/DRESSINGS) ×4 IMPLANT
TAPE CLOTH SURG 4X10 WHT LF (GAUZE/BANDAGES/DRESSINGS) ×2 IMPLANT
TIP APPLICATOR SPRAY EXTEND 16 (VASCULAR PRODUCTS) IMPLANT
TOWEL OR 17X24 6PK STRL BLUE (TOWEL DISPOSABLE) ×6 IMPLANT
TOWEL OR 17X26 10 PK STRL BLUE (TOWEL DISPOSABLE) ×8 IMPLANT
TRAP SPECIMEN MUCOUS 40CC (MISCELLANEOUS) ×2 IMPLANT
TRAY FOLEY CATH 16FRSI W/METER (SET/KITS/TRAYS/PACK) ×4 IMPLANT
TROCAR XCEL NON-BLD 5MMX100MML (ENDOMECHANICALS) ×2 IMPLANT
TUBE ANAEROBIC SPECIMEN COL (MISCELLANEOUS) IMPLANT
TUNNELER SHEATH ON-Q 11GX8 DSP (PAIN MANAGEMENT) ×2 IMPLANT
WATER STERILE IRR 1000ML POUR (IV SOLUTION) ×8 IMPLANT

## 2014-02-06 NOTE — H&P (View-Only) (Signed)
PCP is Merrilee Seashore, MD Referring Provider is Brand Males, MD  Chief Complaint  Patient presents with  . Interstitial Lung Disease    Surgical eval, Chest CT 12/25/13    HPI: 68 year old woman with a chief complaint of shortness of breath with exertion.  [name] is a 68 year old woman with an enigmatic medical history. She says that back in 1979 around the time of her pregnancy she was having a great deal difficulty with joint pain. That resolved and she really didn't have any other issues until around 1990 when she started having joint pain again. She was diagnosed with systemic lupus based on arthralgias and a malar rash and was treated with nonsteroidal anti-inflammatories. She was never treated with any other medications for that. She eventually stopped following up related to that and says that her blood work was consistent with lupus.  She was doing well until February of this year when she started having shortness of breath. This lasted for about 4-6 weeks but ultimately improved and she did not seek any treatment. This past summer she began having shortness of breath with exertion again. This time however rather than improving it continued to worsen. She was having some pleuritic left-sided chest pain and they'll likely lead to her seeking medical attention. She was treated with antibiotics but did not improve. She ultimately had a CT scan which showed bilateral pulmonary infiltrates and marked hilar and mediastinal adenopathy.  She had a bronchoscopy and endobronchial ultrasound on November 16. Results were indeterminate. There were fragments of a granuloma noted in one lymph node. Bronchoalveolar lavage showed abundant histiocytes but no atypia or malignant cells. Transbronchial biopsy of the right middle lobe showed lympho-plasmacytoid histiocytic inflammation. Although suspicious for sarcoidosis of the results were felt to be nondiagnostic. She is now referred for surgical  biopsy.   Past Medical History  Diagnosis Date  . Dyspnea     increase with exertion  . Headache     "aura" headaches less frequent    Past Surgical History  Procedure Laterality Date  . Tonsillectomy    . Uterine fibroid surgery    . Endobronchial ultrasound Bilateral 01/20/2014    Procedure: ENDOBRONCHIAL ULTRASOUND;  Surgeon: Collene Gobble, MD;  Location: [facility];  Service: Cardiopulmonary;  Laterality: Bilateral;    Family History  Problem Relation Age of Onset  . Heart disease Mother   . Heart disease Father   . Emphysema Father   . Diabetes Mellitus II Mother     Social History History  Substance Use Topics  . Smoking status: Never Smoker   . Smokeless tobacco: Never Used  . Alcohol Use: Yes     Comment: rarely social    Current Outpatient Prescriptions  Medication Sig Dispense Refill  . aspirin 81 MG tablet Take 81 mg by mouth every morning.      No current facility-administered medications for this visit.    No Known Allergies  Review of Systems  Constitutional: Positive for activity change, appetite change and unexpected weight change (lost 7 pounds in 3 months). Negative for fever, chills and diaphoresis.  Respiratory: Positive for cough (nonproductive) and shortness of breath.   Cardiovascular: Positive for chest pain (Left sided lateral and sub-scapular pain a few months ago. None currently).  Musculoskeletal: Positive for arthralgias (None for several years).       Leg cramps  All other systems reviewed and are negative.   BP 139/88 mmHg  Pulse 104  Resp 20  Ht 5\' 4"  (  1.626 m)  Wt 133 lb (60.328 kg)  BMI 22.82 kg/m2  SpO2 93% Physical Exam  Constitutional: She is oriented to person, place, and time. She appears well-developed and well-nourished. No distress.  HENT:  Head: Normocephalic and atraumatic.  Eyes: EOM are normal. Pupils are equal, round, and reactive to light.  Neck: Neck supple. No thyromegaly present.  Cardiovascular:  Normal rate, regular rhythm and normal heart sounds.   Pulmonary/Chest: She has no wheezes. She has rales (bibasilar).  Abdominal: Soft. There is no tenderness.  Musculoskeletal: She exhibits no edema.  Lymphadenopathy:    She has no cervical adenopathy.  Neurological: She is alert and oriented to person, place, and time. No cranial nerve deficit.  Skin: Skin is warm and dry.  Vitals reviewed.    Diagnostic Tests: CT CHEST WITH CONTRAST 12/25/2013  TECHNIQUE: Multidetector CT imaging of the chest was performed during intravenous contrast administration.  CONTRAST: 6mL OMNIPAQUE IOHEXOL 300 MG/ML SOLN  COMPARISON: None.  FINDINGS: THORACIC INLET/BODY WALL:  No acute abnormality.  MEDIASTINUM:  There is no cardiomegaly. No pericardial effusion. Atherosclerosis, including the markedly narrowed left subclavian artery near the vertebral artery origin. The left vertebral artery is grossly patent.  There is confluent masses in the bilateral hila, infiltrating the middle mediastinum and partially encasing the descending thoracic aorta. There is subpleural and pleural extension in the left posterior chest. Irregular-shaped limits reproducible size measurement, but for estimation purposes, left hilar mass measures greater than 4 cm. The mass results in narrowing and distortion of the pulmonary arterial tree diffusely. There is no central pulmonary filling defect identified. No acute aortic findings.  LUNG WINDOWS:  Patchy bilateral lung opacities, present in all lobes. There is a basilar predominance. These have ground-glass predominantly, although there is also dense consolidation with air bronchograms. Centrally the nodules are denser. Small left pleural effusion.  UPPER ABDOMEN:  11 mm and 14 mm short axis adenopathy around left diaphragm crus.  OSSEOUS:  There is a lucency within the posterior T12 body which appears geographic and low-density,  favoring a hemangioma over metastatic disease.  These results will be called to the ordering clinician or representative by the Radiologist Assistant, and communication documented in the PACS or zVision Dashboard.  IMPRESSION: 1. Diffuse malignant-appearing adenopathy in the pulmonary hila and mediastinum. There is vessel encasement, left pleural involvement, and upper abdominal adenopathy. The appearance favors lymphoma. 2. Diffuse bilateral airspace disease which could reflect a atypical/opportunistic infection or metastatic disease. 3. T12 vertebral body lucency, favor hemangioma. Attention on follow-up. 4. 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   Electronically Signed  By: Jorje Guild M.D.  On: 12/25/2013 17:00  Impression: 68 year old woman with bilateral interstitial lung disease and hilar and mediastinal adenopathy. She needs a surgical biopsy of both for definitive diagnosis. Most likely this represents a common process, but it also is possible that there are 2 separate processes going on such as ILD and lymphoma.  I discussed the proposed operation with [name]. We will plan to do a left video-assisted thoracoscopy for lung biopsy and lymph node biopsy. I described the general nature of the operation to her including the need for general anesthesia, the incisions be used, the use of a chest tube postoperatively, the expected hospital stay, and the overall recovery. She does understand that she cannot go home alone after the procedure and will need assistance. I informed her of the indications, risks, benefits, and alternatives. She understands the risk include,  but are not limited to death, MI, DVT, PE, bleeding, possible need for transfusion, infection, prolonged air leak, cardiac arrhythmias, as well as the possibility of unforeseeable, occasions.  She understands and accepts the risks and wishes to  proceed.  Plan: Left video-assisted thoracoscopy for lung biopsy and lymph node biopsy on Thursday, December 2.

## 2014-02-06 NOTE — Brief Op Note (Addendum)
02/06/2014  11:57 AM  PATIENT:  [name]  68 y.o. female  PRE-OPERATIVE DIAGNOSIS:   PULMONARY INFILTRATES WITH LYMHADENOPATHY  POST-OPERATIVE DIAGNOSIS:  PULMONARY INFILTRATES WITH LYMPHADENOPATHY (POSSIBLE LYMPHOMA BY FROZEN SECTION)  PROCEDURE:   LEFT VIDEO ASSISTED THORACOSCOPY  LEFT UPPER LOBE BIOPSIES x 2  LEFT LOWER LOBE BIOPSY x 1  LYMPH NODE BIOPSY  SURGEON:  Melrose Nakayama, MD  ASSISTANT: Suzzanne Cloud, PA-C  ANESTHESIA:   general  SPECIMEN:  Source of Specimen:  left upper and lower lobes  DISPOSITION OF SPECIMEN:  Pathology  DRAINS: 28 Fr CT  PATIENT CONDITION:  PACU - hemodynamically stable.   Frozen section- abundant lymphocytes, ? lymphoma

## 2014-02-06 NOTE — Anesthesia Procedure Notes (Signed)
Procedure Name: Intubation Date/Time: 02/06/2014 10:20 AM Performed by: Mariea Clonts Pre-anesthesia Checklist: Emergency Drugs available, Timeout performed, Suction available, Patient identified and Patient being monitored Patient Re-evaluated:Patient Re-evaluated prior to inductionOxygen Delivery Method: Circle system utilized Preoxygenation: Pre-oxygenation with 100% oxygen Intubation Type: IV induction Ventilation: Mask ventilation without difficulty Laryngoscope Size: Mac and 3 Grade View: Grade II Tube type: Oral Endobronchial tube: Left and Double lumen EBT and 35 Fr Number of attempts: 2 Airway Equipment and Method: Fiberoptic brochoscope Placement Confirmation: ETT inserted through vocal cords under direct vision,  positive ETCO2 and breath sounds checked- equal and bilateral Tube secured with: Tape Dental Injury: Teeth and Oropharynx as per pre-operative assessment

## 2014-02-06 NOTE — Progress Notes (Signed)
Unable to obtain urine sample before surgery.

## 2014-02-06 NOTE — Transfer of Care (Signed)
Immediate Anesthesia Transfer of Care Note  Patient: [name]  Procedure(s) Performed: Procedure(s): VIDEO ASSISTED THORACOSCOPY (Left) LUNG BIOPSY (Left) LYMPH NODE BIOPSY (N/A)  Patient Location: PACU  Anesthesia Type:General  Level of Consciousness: awake, alert  and oriented  Airway & Oxygen Therapy: Patient Spontanous Breathing and Patient connected to nasal cannula oxygen  Post-op Assessment: Report given to PACU RN and Post -op Vital signs reviewed and stable  Post vital signs: Reviewed and stable  Complications: No apparent anesthesia complications

## 2014-02-06 NOTE — Progress Notes (Signed)
Lunch relief by S. Delphina Cahill RN

## 2014-02-06 NOTE — Anesthesia Postprocedure Evaluation (Signed)
  Anesthesia Post-op Note  Patient: [name]  Procedure(s) Performed: Procedure(s): VIDEO ASSISTED THORACOSCOPY (Left) LUNG BIOPSY (Left) LYMPH NODE BIOPSY (N/A)  Patient Location: PACU  Anesthesia Type:General  Level of Consciousness: awake, alert , oriented and patient cooperative  Airway and Oxygen Therapy: Patient Spontanous Breathing  Post-op Pain: mild  Post-op Assessment: Post-op Vital signs reviewed, Patient's Cardiovascular Status Stable, Respiratory Function Stable, Patent Airway, No signs of Nausea or vomiting and Pain level controlled  Post-op Vital Signs: stable  Last Vitals:  Filed Vitals:   02/06/14 1450  BP:   Pulse: 93  Temp:   Resp: 24    Complications: No apparent anesthesia complications

## 2014-02-06 NOTE — Anesthesia Preprocedure Evaluation (Signed)
Anesthesia Evaluation  Patient identified by MRN, date of birth, ID band Patient awake    Reviewed: Allergy & Precautions, H&P , NPO status , Patient's Chart, lab work & pertinent test results  Airway        Dental   Pulmonary shortness of breath, pneumonia -,          Cardiovascular     Neuro/Psych  Headaches,    GI/Hepatic   Endo/Other    Renal/GU      Musculoskeletal   Abdominal   Peds  Hematology   Anesthesia Other Findings   Reproductive/Obstetrics                             Anesthesia Physical Anesthesia Plan  ASA: II  Anesthesia Plan: General   Post-op Pain Management:    Induction: Intravenous  Airway Management Planned: Double Lumen EBT  Additional Equipment: Arterial line and CVP  Intra-op Plan:   Post-operative Plan: Possible Post-op intubation/ventilation  Informed Consent: I have reviewed the patients History and Physical, chart, labs and discussed the procedure including the risks, benefits and alternatives for the proposed anesthesia with the patient or authorized representative who has indicated his/her understanding and acceptance.     Plan Discussed with: CRNA, Anesthesiologist and Surgeon  Anesthesia Plan Comments:         Anesthesia Quick Evaluation

## 2014-02-06 NOTE — Progress Notes (Signed)
Utilization review completed.  

## 2014-02-06 NOTE — Plan of Care (Signed)
Problem: Phase I Progression Outcomes Goal: Hemodynamically stable Outcome: Progressing Goal: Chest tube drainage < 200 cc/hr Outcome: Progressing Goal: O2 sats > or equal to 88% with O2 Outcome: Progressing Goal: Pain controlled with appropriate interventions Outcome: Progressing Goal: Activity tolerated as ordered Outcome: Progressing Goal: If Diabetic, blood sugar < 150 Outcome: Not Applicable Date Met:  03/16/01 Goal: Initial discharge plan identified Outcome: Progressing

## 2014-02-06 NOTE — Interval H&P Note (Signed)
History and Physical Interval Note:  02/06/2014 8:43 AM  [name]  has presented today for surgery, with the diagnosis of PULMONARY INFILTRATES ADENOPATHY  The various methods of treatment have been discussed with the patient and family. After consideration of risks, benefits and other options for treatment, the patient has consented to  Procedure(s): VIDEO ASSISTED THORACOSCOPY (Left) LUNG BIOPSY (N/A) LYMPH NODE BIOPSY (N/A) as a surgical intervention .  The patient's history has been reviewed, patient examined, no change in status, stable for surgery.  I have reviewed the patient's chart and labs.  Questions were answered to the patient's satisfaction.     [name] C

## 2014-02-07 ENCOUNTER — Encounter (HOSPITAL_COMMUNITY): Payer: Self-pay | Admitting: Thoracic Surgery (Cardiothoracic Vascular Surgery)

## 2014-02-07 ENCOUNTER — Inpatient Hospital Stay (HOSPITAL_COMMUNITY): Payer: Medicare Other

## 2014-02-07 LAB — BLOOD GAS, ARTERIAL
ACID-BASE DEFICIT: 1.8 mmol/L (ref 0.0–2.0)
Bicarbonate: 21.7 mEq/L (ref 20.0–24.0)
O2 Content: 3 L/min
O2 Saturation: 98.4 %
PATIENT TEMPERATURE: 98
TCO2: 22.7 mmol/L (ref 0–100)
pCO2 arterial: 31.7 mmHg — ABNORMAL LOW (ref 35.0–45.0)
pH, Arterial: 7.447 (ref 7.350–7.450)
pO2, Arterial: 107 mmHg — ABNORMAL HIGH (ref 80.0–100.0)

## 2014-02-07 LAB — CBC
HCT: 36.7 % (ref 36.0–46.0)
Hemoglobin: 12.3 g/dL (ref 12.0–15.0)
MCH: 27.8 pg (ref 26.0–34.0)
MCHC: 33.5 g/dL (ref 30.0–36.0)
MCV: 83 fL (ref 78.0–100.0)
Platelets: 287 10*3/uL (ref 150–400)
RBC: 4.42 MIL/uL (ref 3.87–5.11)
RDW: 14.7 % (ref 11.5–15.5)
WBC: 2.4 10*3/uL — ABNORMAL LOW (ref 4.0–10.5)

## 2014-02-07 LAB — BASIC METABOLIC PANEL
Anion gap: 11 (ref 5–15)
BUN: 10 mg/dL (ref 6–23)
CALCIUM: 7.7 mg/dL — AB (ref 8.4–10.5)
CO2: 20 meq/L (ref 19–32)
Chloride: 98 mEq/L (ref 96–112)
Creatinine, Ser: 0.87 mg/dL (ref 0.50–1.10)
GFR calc Af Amer: 78 mL/min — ABNORMAL LOW (ref >90)
GFR, EST NON AFRICAN AMERICAN: 67 mL/min — AB (ref >90)
GLUCOSE: 111 mg/dL — AB (ref 70–99)
Potassium: 4.7 mEq/L (ref 3.7–5.3)
Sodium: 129 mEq/L — ABNORMAL LOW (ref 137–147)

## 2014-02-07 MED ORDER — METOCLOPRAMIDE HCL 5 MG/ML IJ SOLN
5.0000 mg | Freq: Four times a day (QID) | INTRAMUSCULAR | Status: AC
  Administered 2014-02-07 – 2014-02-08 (×4): 5 mg via INTRAVENOUS
  Filled 2014-02-07 (×4): qty 1

## 2014-02-07 NOTE — Op Note (Signed)
[name], [name]                  ACCOUNT NO.:  [id number]  MEDICAL RECORD NO.:  [id number]  LOCATION:  3S06C                        FACILITY:  [facility]  PHYSICIAN:  Revonda Standard. Roxan Hockey, M.D.DATE OF BIRTH:  April 29, 1945  DATE OF PROCEDURE:  02/06/2014 DATE OF DISCHARGE:                              OPERATIVE REPORT   PREOPERATIVE DIAGNOSIS:  Diffuse lung infiltrates and adenopathy.  POSTOPERATIVE DIAGNOSIS:  Diffuse lung infiltrates and adenopathy.  PROCEDURE:  Left video-assisted thoracoscopy, lung biopsy, lymph node biopsy, On-Q local anesthetic catheter placement.  SURGEON:  Revonda Standard. Roxan Hockey, M.D.  ASSISTANT:  Suzzanne Cloud, P.A.  ANESTHESIA:  General.  FINDINGS:  Markedly enlarged hilar and mediastinal lymph nodes.  Lung diffusely involved.  Frozen section revealed abundant lymphocytes, suspicious for lymphoma.  CLINICAL NOTE:  [name] is a 67 year old woman who presents with shortness of breath on exertion.  Workup revealed diffuse bilateral pulmonary infiltrates and marked mediastinal and hilar adenopathy.  She had undergone endobronchial ultrasound and bronchoscopy, which was nondiagnostic.  She was referred for lung biopsy and lymph node biopsy. The patient was advised to have a left video-assisted thoracoscopic approach.  The indications risks, benefits, and alternatives were discussed in detail with the patient.  She understood and accepted the risks and agreed to proceed.  OPERATIVE NOTE:  [name] was brought to the operating room on February 06, 2014.  She had induction of general anesthesia and was intubated with a double-lumen endotracheal tube.  A Foley catheter was placed. Sequential compressive devices were placed on the calves for DVT prophylaxis.  Intravenous antibiotics were administered.  She was placed in a right lateral decubitus position and the left chest was prepped and draped in the usual sterile fashion.  Single lung ventilation of the  right lung was initiated and was tolerated well throughout the procedure.  An incision was made in the seventh intercostal space in the midaxillary line.  A 5-mm port was inserted into the chest.  Thoracoscope was advanced into the chest, there was good isolation of the left lung.  An incision was made in the fifth interspace anterolaterally, no rib spreading was performed during the procedure.  The lingula was grasped and a biopsy was performed with sequential firings of an endoscopic GIA stapler.  The specimen was sent for frozen section.  While awaiting those results, additional biopsies were taken from more posteriorly in the left upper lobe as well as at the base of the left lower lobe.  The lung was diffusely involved and had a slightly nodular character.  The lung then was retracted posteriorly, there was a large hilar node that was dissected out, and came out easily.  This was grossly enlarged and grayish in appearance.  It was sent for permanent pathology.  Next, the pleura overlying the aortopulmonary window was incised and a large AP window node was taken as well.  There was good hemostasis at both these sites after removal of the nodes.  Frozen section returned showing abundant lymphocytes suspicious for lymphoma.  Final inspection was made of the staple lines.  There was a small area where the lung had torn adjacent to the staple line at the  lingula, this was oversewn with a 3-0 Vicryl suture, and then ProGEL was applied to this area.  An On-Q local anesthetic catheter was tunneled via a separate stab incision posteriorly into a subpleural location and primed with 5 mL of 0.5% Marcaine.  A chest tube was placed through the original port incision and directed to the apex.  The left lung was reinflated.  There was good aeration of both lobes.  The incision was closed with a running #1 Vicryl fascial suture, 2-0 Vicryl subcutaneous suture, and a 3-0 Vicryl subcuticular  suture.  All sponge, needle, and instrument counts were correct at the end of the procedure.  The patient was taken from the operating room to the recovery room, extubated, and in good condition.     Revonda Standard Roxan Hockey, M.D.     SCH/MEDQ  D:  02/06/2014  T:  02/07/2014  Job:  [id number]

## 2014-02-07 NOTE — Progress Notes (Addendum)
[address]       [place] [address]             [phone]          1 Day Post-Op Procedure(s) (LRB): VIDEO ASSISTED THORACOSCOPY (Left) LUNG BIOPSY (Left) LYMPH NODE BIOPSY (N/A)  Subjective: Has been nauseated since she got up to the chair, but otherwise has had a stable.    Objective: Vital signs in last 24 hours: Patient Vitals for the past 24 hrs:  BP Temp Temp src Pulse Resp SpO2 Height Weight  02/07/14 0332 136/72 mmHg 97.9 F (36.6 C) Oral 90 (!) 22 95 % - -  02/06/14 2339 139/72 mmHg 98 F (36.7 C) Oral 95 (!) 24 94 % - -  02/06/14 2018 132/67 mmHg 98.1 F (36.7 C) Oral 91 (!) 26 94 % - -  02/06/14 1800 - - - 97 17 94 % - -  02/06/14 1600 - - - 93 19 96 % - -  02/06/14 1524 131/68 mmHg 97.7 F (36.5 C) Oral 99 17 98 % 5\' 4"  (1.626 m) 143 lb 4.8 oz (65 kg)  02/06/14 1450 - - - 93 (!) 24 95 % - -  02/06/14 1449 (!) 131/55 mmHg - - 93 (!) 26 94 % - -  02/06/14 1430 - 97.7 F (36.5 C) - 91 20 95 % - -  02/06/14 1419 (!) 124/59 mmHg - - - - - - -  02/06/14 1400 - - - 92 (!) 22 94 % - -  02/06/14 1348 (!) 126/56 mmHg - - - - - - -  02/06/14 1345 - - - 83 (!) 24 90 % - -  02/06/14 1330 - - - 83 (!) 25 91 % - -  02/06/14 1320 - - - 85 (!) 25 95 % - -  02/06/14 1319 (!) 122/50 mmHg - - - - - - -  02/06/14 1315 - - - 86 17 96 % - -  02/06/14 1304 126/62 mmHg - - - - - - -  02/06/14 1300 - - - 82 (!) 22 94 % - -  02/06/14 1249 (!) 118/57 mmHg - - - - - - -  02/06/14 1247 - - - - (!) 22 - - -  02/06/14 1241 - - - - 20 96 % - -  02/06/14 1237 - - - 84 (!) 23 93 % - -  02/06/14 1234 (!) 124/59 mmHg - - - - - - -  02/06/14 1230 - - - 87 19 96 % - -  02/06/14 1228 - 97.4 F (36.3 C) - - - - - -  02/06/14 1219 121/60 mmHg - - - - - - -  02/06/14 0847 (!) 164/91 mmHg 97.6 F (36.4 C) Oral (!) 104 20 95 % - 133 lb (60.328 kg)   Current Weight  02/06/14 143 lb 4.8 oz (65 kg)     Intake/Output from previous day: 12/03 0701 - 12/04  0700 In: 2055 [P.O.:410; I.V.:1595; IV Piggyback:50] Out: 9147 [Urine:1000; Chest Tube:550]    PHYSICAL EXAM:  Heart: RRR Lungs: Clear, slightly decreased BS in bases Wound: Dressed and dry Chest tube: No air leak with cough    Lab Results: CBC: Recent Labs  02/06/14 0930 02/07/14 0355  WBC 2.6* 2.4*  HGB 14.4 12.3  HCT 41.9 36.7  PLT 315 287   BMET:  Recent Labs  02/06/14 0930 02/07/14 0355  NA 135* 129*  K 4.3 4.7  CL 103 98  CO2 18* 20  GLUCOSE 95 111*  BUN 12 10  CREATININE 0.92 0.87  CALCIUM 8.9 7.7*    PT/INR:  Recent Labs  02/06/14 0930  LABPROT 15.0  INR 1.16   CXR: FINDINGS: Stable cardiomediastinal silhouette. Left-sided chest tube is unchanged in position. Minimal left apical pneumothorax is noted. Left internal jugular catheter is unchanged with distal tip overlying expected position of the SVC. Stable bibasilar airspace opacities are noted concerning for edema or pneumonia. Bony thorax is intact.  IMPRESSION: Left-sided chest tube is unchanged in position with minimal left apical pneumothorax present. Stable bibasilar opacities are noted concerning for pneumonia or edema.   Assessment/Plan: S/P Procedure(s) (LRB): VIDEO ASSISTED THORACOSCOPY (Left) LUNG BIOPSY (Left) LYMPH NODE BIOPSY (N/A) CT with no air leak, but had high output over last 24 hrs.  CXR stable with minimal left apical ptx.  Hopefully can place CT to water seal. GI- tx nausea symptomatically.   Decrease IVF, d/c a-line, leave Foley one more day for I/O's.   LOS: 1 day    COLLINS,GINA H 02/07/2014  Patient seen and examined She had some gastric distention on CXR this morning- has already eaten breakfast, will give reglan x 24 hours No air leak- CT to water seal Dc CT when < 300 ml/ day PATH pending- this is most likely lymphoma- will need Oncology consult once path result is available

## 2014-02-07 NOTE — Telephone Encounter (Signed)
LEFT MESSAGE FOR PATIENT TO RETURN CALL TO SCHEDULE NP APPT.  °

## 2014-02-07 NOTE — Discharge Summary (Signed)
[address]       [place] [address]             [phone]              Discharge Summary  Name: [name] DOB: 12-28-1945 68 y.o. MRN: [id number]   Admission Date: 02/06/2014 Discharge Date:     Admitting Diagnosis: Active Problems:   Pulmonary infiltrates    Discharge Diagnosis:  Active Problems:   Pulmonary infiltrates Non-Hodgkin's lymphoma  Past Medical History  Diagnosis Date  . Dyspnea     increase with exertion  . Headache     "aura" headaches less frequent  . Tachycardia   . Pneumonia     as a child  . Chronic joint pain     Was thought to have lupus 25 + years ago, treated for lupus but recently found that she does not have lupus      Procedures: LEFT VIDEO ASSISTED THORACOSCOPY - 02/06/2014  LEFT UPPER LOBE BIOPSIES x 2  LEFT LOWER LOBE BIOPSY x 1  LYMPH NODE BIOPSY    HPI:  The patient is a 68 y.o. female with an enigmatic medical history. She says that back in 1979 around the time of her pregnancy, she was having a great deal difficulty with joint pain. That resolved and she really didn't have any other issues until around 1990 when she started having joint pain again. She was diagnosed with systemic lupus based on arthralgias and a malar rash and was treated with nonsteroidal anti-inflammatories. She was never treated with any other medications for that. She eventually stopped following up related to that and says that her blood work was consistent with lupus.  She was doing well until February of this year when she started having shortness of breath. This lasted for about 4-6 weeks but ultimately improved and she did not seek any treatment. This past summer, she began having shortness of breath with exertion again. This time, however, rather than improving, it continued to worsen. She was having some pleuritic left-sided chest pain and that led to her seeking medical attention. Cardiac workup was negative. She was  treated with antibiotics but did not improve. She ultimately had a CT scan which showed bilateral pulmonary infiltrates and marked hilar and mediastinal adenopathy.  She had a bronchoscopy and endobronchial ultrasound on November 16. Results were indeterminate. There were fragments of a granuloma noted in one lymph node. Bronchoalveolar lavage showed abundant histiocytes but no atypia or malignant cells. Transbronchial biopsy of the right middle lobe showed lympho-plasmacytoid histiocytic inflammation. Although suspicious for sarcoidosis, the results were felt to be nondiagnostic. She was subsequently referred to Dr. Roxan Hockey for surgical biopsy for definitive diagnosis. All risks, benefits and alternatives of surgery were explained in detail, and the patient agreed to proceed.    Hospital Course:  The patient was admitted to [facility] on 02/06/2014. The patient was taken to the operating room and underwent the above procedure.    The postoperative course:  The patient has made good overall progress. Chest tube has been removed. She is tolerating ambulation. Incision  is healing well. Oxygen has been weaned. Pathology remains  pending but preliminary report is of lymphoma. She is stable for discharge at this time.      Recent vital signs:  Filed Vitals:   02/07/14 0825  BP: 122/68  Pulse: 83  Temp:   Resp: 22    Recent laboratory studies:  CBC:  Recent Labs  02/06/14 0930 02/07/14 0355  WBC 2.6* 2.4*  HGB 14.4 12.3  HCT 41.9 36.7  PLT 315 287   BMET:  Recent Labs  02/06/14 0930 02/07/14 0355  NA 135* 129*  K 4.3 4.7  CL 103 98  CO2 18* 20  GLUCOSE 95 111*  BUN 12 10  CREATININE 0.92 0.87  CALCIUM 8.9 7.7*    PT/INR:  Recent Labs  02/06/14 0930  LABPROT 15.0  INR 1.16     Discharge Medications:     Medication List    TAKE these medications        aspirin 81 MG tablet  Take 81 mg by mouth every morning.     oxyCODONE 5 MG immediate release tablet    Commonly known as:  Oxy IR/ROXICODONE  Take 1-2 tablets (5-10 mg total) by mouth every 4 (four) hours as needed for severe pain.         Discharge Instructions:  The patient is to refrain from driving, heavy lifting or strenuous activity.  May shower daily and clean incisions with soap and water.  May resume regular diet.   Follow Up: Follow-up Information    Follow up with Melrose Nakayama, MD.   Specialty:  Cardiothoracic Surgery   Why:  office will contact you for appts to see surgeon and have chest tube sutures removed.   Contact information:   [place] [address] [place] [address] [phone]       Final pathology: pending  Addendum 12/15/15OW CYTOMETRY REPORT ERPRETATION Interpretation Tissue-Flow Cytometry - MONOCLONAL B-CELL POPULATION IDENTIFIED. 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   COLLINS,GINA H 02/07/2014, 9:37 AM

## 2014-02-07 NOTE — Progress Notes (Signed)
Medicare Important Message given? YES  (If response is "NO", the following Medicare IM given date fields will be blank)  Date Medicare IM given: 02/07/14 Medicare IM given by:  Desirae Mancusi  

## 2014-02-08 ENCOUNTER — Inpatient Hospital Stay (HOSPITAL_COMMUNITY): Payer: Medicare Other

## 2014-02-08 LAB — GLUCOSE, CAPILLARY: GLUCOSE-CAPILLARY: 119 mg/dL — AB (ref 70–99)

## 2014-02-08 LAB — COMPREHENSIVE METABOLIC PANEL
ALT: 9 U/L (ref 0–35)
AST: 12 U/L (ref 0–37)
Albumin: 2.2 g/dL — ABNORMAL LOW (ref 3.5–5.2)
Alkaline Phosphatase: 42 U/L (ref 39–117)
Anion gap: 10 (ref 5–15)
BUN: 8 mg/dL (ref 6–23)
CALCIUM: 8.1 mg/dL — AB (ref 8.4–10.5)
CO2: 24 meq/L (ref 19–32)
CREATININE: 0.75 mg/dL (ref 0.50–1.10)
Chloride: 100 mEq/L (ref 96–112)
GFR, EST NON AFRICAN AMERICAN: 86 mL/min — AB (ref >90)
GLUCOSE: 95 mg/dL (ref 70–99)
Potassium: 4.3 mEq/L (ref 3.7–5.3)
Sodium: 134 mEq/L — ABNORMAL LOW (ref 137–147)
Total Bilirubin: 0.4 mg/dL (ref 0.3–1.2)
Total Protein: 5.8 g/dL — ABNORMAL LOW (ref 6.0–8.3)

## 2014-02-08 LAB — CBC
HEMATOCRIT: 38.6 % (ref 36.0–46.0)
Hemoglobin: 12.9 g/dL (ref 12.0–15.0)
MCH: 28 pg (ref 26.0–34.0)
MCHC: 33.4 g/dL (ref 30.0–36.0)
MCV: 83.7 fL (ref 78.0–100.0)
PLATELETS: 268 10*3/uL (ref 150–400)
RBC: 4.61 MIL/uL (ref 3.87–5.11)
RDW: 14.6 % (ref 11.5–15.5)
WBC: 1.6 10*3/uL — ABNORMAL LOW (ref 4.0–10.5)

## 2014-02-08 NOTE — Progress Notes (Signed)
Wasted 12mL (230 mcg) fentanyl in sink. Witnessed by [employer].

## 2014-02-08 NOTE — Progress Notes (Addendum)
[facility] DAILY ICU PROGRESS NOTE                   [address]            [place] [address]          [phone]   2 Days Post-Op Procedure(s) (LRB): VIDEO ASSISTED THORACOSCOPY (Left) LUNG BIOPSY (Left) LYMPH NODE BIOPSY (N/A)  Total Length of Stay:  LOS: 2 days   Subjective: Feeling pretty well, some dizziness with ambulation, minimal pca use  Objective: Vital signs in last 24 hours: Temp:  [97.8 F (36.6 C)-98.5 F (36.9 C)] 97.8 F (36.6 C) (12/05 0749) Pulse Rate:  [53-92] 92 (12/05 0749) Cardiac Rhythm:  [-] Normal sinus rhythm (12/05 0800) Resp:  [16-28] 18 (12/05 0807) BP: (52-154)/(35-77) 134/68 mmHg (12/05 0745) SpO2:  [90 %-94 %] 94 % (12/05 0749) Arterial Line BP: (44-124)/(33-81) 123/81 mmHg (12/05 0745)  Filed Weights   02/06/14 0847 02/06/14 1524  Weight: 133 lb (60.328 kg) 143 lb 4.8 oz (65 kg)    Weight change:    Hemodynamic parameters for last 24 hours:    Intake/Output from previous day: 12/04 0701 - 12/05 0700 In: 1381.7 [P.O.:120; I.V.:1261.7] Out: 8786 [Urine:3350; Chest Tube:460]  Intake/Output this shift: Total I/O In: 50 [I.V.:50] Out: 100 [Chest Tube:100]  Current Meds: Scheduled Meds: . acetaminophen  1,000 mg Oral 4 times per day   Or  . acetaminophen (TYLENOL) oral liquid 160 mg/5 mL  1,000 mg Oral 4 times per day  . aspirin  81 mg Oral q morning - 10a  . bisacodyl  10 mg Oral Daily  . fentaNYL   Intravenous 6 times per day  . senna-docusate  1 tablet Oral QHS   Continuous Infusions: . bupivacaine ON-Q pain pump    . dextrose 5 % and 0.45 % NaCl with KCl 20 mEq/L 50 mL/hr at 02/07/14 0937   PRN Meds:.diphenhydrAMINE **OR** diphenhydrAMINE, Influenza vac split quadrivalent PF, naloxone **AND** sodium chloride, ondansetron (ZOFRAN) IV, oxyCODONE, pneumococcal 23 valent vaccine, potassium chloride, traMADol  General appearance: alert, cooperative and no distress Heart: regular rate and rhythm Lungs: mildly  dim in bases Abdomen: benign Extremities: PAS in place Wound: incis healing well  Lab Results: CBC: Recent Labs  02/07/14 0355 02/08/14 0410  WBC 2.4* 1.6*  HGB 12.3 12.9  HCT 36.7 38.6  PLT 287 268   BMET:  Recent Labs  02/07/14 0355 02/08/14 0410  NA 129* 134*  K 4.7 4.3  CL 98 100  CO2 20 24  GLUCOSE 111* 95  BUN 10 8  CREATININE 0.87 0.75  CALCIUM 7.7* 8.1*    PT/INR:  Recent Labs  02/06/14 0930  LABPROT 15.0  INR 1.16   Radiology: Dg Chest 2 View  02/06/2014   CLINICAL DATA:  Pulmonary infiltrates. Adenopathy. Status post thoracoscopy with lung biopsy and lymph node biopsy.  EXAM: CHEST  2 VIEW  COMPARISON:  01/21/2014  FINDINGS: There is no pneumothorax. Patchy bilateral pulmonary infiltrates for cyst as does the mediastinal and hilar adenopathy. Small left pleural effusion is unchanged. Aeration is improved bilaterally.  IMPRESSION: No significant change since the prior study.  No pneumothorax.   Electronically Signed   By: Rozetta Nunnery M.D.   On: 02/06/2014 09:20   Dg Chest Port 1 View  02/08/2014   CLINICAL DATA:  Pulmonary infiltrates  EXAM: PORTABLE CHEST - 1 VIEW  COMPARISON:  02/07/2014  FINDINGS: Left jugular central venous catheter in unchanged  position.  Left sided chest tube in unchanged position. Small left apical pneumothorax measuring 4 mm.  Bilateral patchy lower lobe airspace disease, left greater than right, similar in appearance to the prior examination. No significant pleural effusion. Stable cardiomediastinal silhouette. Thoracic aortic atherosclerosis. No acute osseous abnormality.  IMPRESSION: 1. Left sided chest tube with a small left apical pneumothorax. 2. Bilateral patchy airspace disease, left greater than right, most consistent with persistent pneumonia. Mild improved aeration at the left lung base.   Electronically Signed   By: Kathreen Devoid   On: 02/08/2014 08:14   Dg Chest Port 1 View  02/07/2014   CLINICAL DATA:  Pulmonary infiltrates.   EXAM: PORTABLE CHEST - 1 VIEW  COMPARISON:  February 06, 2014.  FINDINGS: Stable cardiomediastinal silhouette. Left-sided chest tube is unchanged in position. Minimal left apical pneumothorax is noted. Left internal jugular catheter is unchanged with distal tip overlying expected position of the SVC. Stable bibasilar airspace opacities are noted concerning for edema or pneumonia. Bony thorax is intact.  IMPRESSION: Left-sided chest tube is unchanged in position with minimal left apical pneumothorax present. Stable bibasilar opacities are noted concerning for pneumonia or edema.   Electronically Signed   By: Sabino Dick M.D.   On: 02/07/2014 07:55   Dg Chest Port 1 View  02/06/2014   CLINICAL DATA:  Left central line placement post VATS  EXAM: PORTABLE CHEST - 1 VIEW  COMPARISON:  02/06/2014  FINDINGS: Cardiomediastinal silhouette is stable. Patchy bilateral lower lobe infiltrates again noted. There is a left chest tube in place. No pneumothorax. Left IJ central line with tip in SVC. Bilateral hilar adenopathy again noted.  IMPRESSION: Patchy bilateral lower lobe infiltrates again noted. There is a left chest tube in place. No pneumothorax. Left IJ central line with tip in SVC.   Electronically Signed   By: Lahoma Crocker M.D.   On: 02/06/2014 13:06     Assessment/Plan: S/P Procedure(s) (LRB): VIDEO ASSISTED THORACOSCOPY (Left) LUNG BIOPSY (Left) LYMPH NODE BIOPSY (N/A)   1 d/c chest tube 2 wean O2, cont pulm toilet 3 d/c pca 4 hemodyn stable 5 leukopenia is worse- path pending, will need oncol eval    GOLD,WAYNE E 02/08/2014 8:41 AM  Agree with above. CXR report after removal of chest tube is ok. We still can not look at CXR's due to computer problems. Path is still pending. She will need oncology eval after path is back if it shows a malignant process.

## 2014-02-08 NOTE — Progress Notes (Signed)
After walking a lap around the unit, patient sitting up in chair, called RN into the room. HR in 50s, Complaining of "shades" being over her eyes. BP 52/35. Ashen colored. Chair flattened. EKG obtained. Dr. Cyndia Bent notified. No new orders received. After speaking with MD, patient states she is feeling much better, BP up to 114/47, HR in the 80s with frequent PACs. Will continue monitor.   Lum Babe, RN

## 2014-02-09 ENCOUNTER — Inpatient Hospital Stay (HOSPITAL_COMMUNITY): Payer: Medicare Other

## 2014-02-09 DIAGNOSIS — C8519 Unspecified B-cell lymphoma, extranodal and solid organ sites: Secondary | ICD-10-CM | POA: Diagnosis not present

## 2014-02-09 LAB — BODY FLUID CULTURE: Culture: NO GROWTH

## 2014-02-09 LAB — TISSUE CULTURE
Culture: NO GROWTH
Culture: NO GROWTH

## 2014-02-09 LAB — CLOSTRIDIUM DIFFICILE BY PCR: Toxigenic C. Difficile by PCR: NEGATIVE

## 2014-02-09 MED ORDER — OXYCODONE HCL 5 MG PO TABS: 5.0000 mg | ORAL_TABLET | ORAL | Status: DC | PRN

## 2014-02-09 NOTE — Discharge Instructions (Signed)
Thoracoscopy °Care After °Refer to this sheet in the next few weeks. These discharge instructions provide you with general information on caring for yourself after you leave the hospital. Your caregiver may also give you specific instructions. Your treatment has been planned according to the most current medical practices available, but unavoidable complications sometimes occur. If you have any problems or questions after discharge, call your caregiver. °HOME CARE INSTRUCTIONS  °· Remove the bandage (dressing) over your chest tube site as directed by your caregiver. °· It is normal to be sore for a couple weeks following surgery. See your caregiver if this seems to be getting worse rather than better. °· Only take over-the-counter or prescription medicines for pain, discomfort, or fever as directed by your caregiver. It is very important to take pain medicine when you need it so that you will cough and breathe deeply enough to clear mucus (phlegm) and expand your lungs. °· If it hurts to cough, hold a pillow against your chest when you cough. This may help with the discomfort. In spite of the discomfort, cough frequently, as this helps protect against getting an infection in your lung (pneumonia). °· Taking deep breaths keeps lungs inflated and protects against pneumonia. Most patients will go home with an incentive spirometer that encourages deep breathing. °· You may resume a normal diet and activities as directed. °· Use showers for bathing until you see your caregiver, or as instructed. °· Change dressings if necessary or as directed. °· Avoid lifting or driving until you are instructed otherwise. °· Make an appointment to see your caregiver for stitch (suture) or staple removal when instructed. °· Do not travel by airplane for 2 weeks after the chest tube is removed. °SEEK MEDICAL CARE IF:  °· You are bleeding from your wounds. °· You have redness, swelling, or increasing pain in the wounds. °· Your heartbeat  feels irregular or very fast. °· There is pus coming from your wounds. °· There is a bad smell coming from the wound or dressing. °SEEK IMMEDIATE MEDICAL CARE IF:  °· You have a fever. °· You develop a rash. °· You have difficulty breathing. °· You develop any reaction or side effects to medicines given. °· You develop lightheadedness or feel faint. °· You develop shortness of breath or chest pain. °MAKE SURE YOU:  °· Understand these instructions. °· Will watch your condition. °· Will get help right away if you are not doing well or get worse. °Document Released: 09/10/2004 Document Revised: 05/16/2011 Document Reviewed: 08/11/2010 °[employer]® Patient Information ©2015 [employer]. This information is not intended to replace advice given to you by your health care provider. Make sure you discuss any questions you have with your health care provider. ° °

## 2014-02-09 NOTE — Progress Notes (Signed)
[facility] DAILY ICU PROGRESS NOTE                   [address]            [place] [address]          [phone]   3 Days Post-Op Procedure(s) (LRB): VIDEO ASSISTED THORACOSCOPY (Left) LUNG BIOPSY (Left) LYMPH NODE BIOPSY (N/A)  Total Length of Stay:  LOS: 3 days   Subjective: Some loose stools, feels fine , stool softeners stopped  Objective: Vital signs in last 24 hours: Temp:  [97.9 F (36.6 C)-98.7 F (37.1 C)] 97.9 F (36.6 C) (12/06 0820) Pulse Rate:  [89-97] 90 (12/06 0820) Cardiac Rhythm:  [-] Normal sinus rhythm (12/06 0800) Resp:  [17-24] 21 (12/06 0820) BP: (130-147)/(71-105) 130/71 mmHg (12/06 0820) SpO2:  [91 %-97 %] 97 % (12/06 0820)  Filed Weights   02/06/14 0847 02/06/14 1524  Weight: 133 lb (60.328 kg) 143 lb 4.8 oz (65 kg)    Weight change:    Hemodynamic parameters for last 24 hours:    Intake/Output from previous day: 12/05 0701 - 12/06 0700 In: 250 [I.V.:250] Out: 300 [Urine:200; Chest Tube:100]  Intake/Output this shift:    Current Meds: Scheduled Meds: . acetaminophen  1,000 mg Oral 4 times per day   Or  . acetaminophen (TYLENOL) oral liquid 160 mg/5 mL  1,000 mg Oral 4 times per day  . aspirin  81 mg Oral q morning - 10a  . bisacodyl  10 mg Oral Daily  . senna-docusate  1 tablet Oral QHS   Continuous Infusions: . bupivacaine ON-Q pain pump    . dextrose 5 % and 0.45 % NaCl with KCl 20 mEq/L 50 mL/hr at 02/07/14 0937   PRN Meds:.Influenza vac split quadrivalent PF, oxyCODONE, pneumococcal 23 valent vaccine, potassium chloride, traMADol  General appearance: alert, cooperative and no distress Heart: regular rate and rhythm Lungs: clear to auscultation bilaterally Abdomen: benign exam Extremities: pas in place Wound: incis healing well  Lab Results: CBC: Recent Labs  02/07/14 0355 02/08/14 0410  WBC 2.4* 1.6*  HGB 12.3 12.9  HCT 36.7 38.6  PLT 287 268   BMET:  Recent Labs  02/07/14 0355  02/08/14 0410  NA 129* 134*  K 4.7 4.3  CL 98 100  CO2 20 24  GLUCOSE 111* 95  BUN 10 8  CREATININE 0.87 0.75  CALCIUM 7.7* 8.1*    PT/INR:  Recent Labs  02/06/14 0930  LABPROT 15.0  INR 1.16   Radiology: Dg Chest 2 View  02/09/2014   CLINICAL DATA:  Shortness of breath  EXAM: CHEST  2 VIEW  COMPARISON:  02/08/2014  FINDINGS: Patchy left greater than right bilateral lower lobe consolidation is reidentified. Overall, aeration is improved. Trace left pleural effusion is improved. Left IJ central line tip terminates over the mid SVC.  IMPRESSION: Improved overall lung aeration.   Electronically Signed   By: Conchita Paris M.D.   On: 02/09/2014 08:25   Dg Chest Port 1 View  02/08/2014   CLINICAL DATA:  Pulmonary infiltrates  EXAM: PORTABLE CHEST - 1 VIEW  COMPARISON:  02/07/2014  FINDINGS: Left jugular central venous catheter in unchanged position.  Left sided chest tube in unchanged position. Small left apical pneumothorax measuring 4 mm.  Bilateral patchy lower lobe airspace disease, left greater than right, similar in appearance to the prior examination. No significant pleural effusion. Stable cardiomediastinal silhouette. Thoracic aortic atherosclerosis. No acute osseous abnormality.  IMPRESSION: 1. Left sided chest tube with a small left apical pneumothorax. 2. Bilateral patchy airspace disease, left greater than right, most consistent with persistent pneumonia. Mild improved aeration at the left lung base.   Electronically Signed   By: Kathreen Devoid   On: 02/08/2014 08:14   Dg Chest Port 1v Same Day  02/08/2014   CLINICAL DATA:  History of lung biopsy, chest tube removal  EXAM: PORTABLE CHEST - 1 VIEW SAME DAY  COMPARISON:  02/08/2014  FINDINGS: Left chest tube has been removed. Tiny stable left apical pneumothorax. Diffuse patchy left lung and right lower lobe nodular airspace disease compatible with pneumonia. No significant interval change. No enlarging effusion. Stable heart size and  vascularity. Atherosclerosis noted of the aorta. Left IJ central line tip upper SVC.  IMPRESSION: Left chest tube removed.  Stable tiny left apical pneumothorax  Left greater than aright bilateral patchy airspace disease compatible with pneumonia.   Electronically Signed   By: Daryll Brod M.D.   On: 02/08/2014 11:43     Assessment/Plan: S/P Procedure(s) (LRB): VIDEO ASSISTED THORACOSCOPY (Left) LUNG BIOPSY (Left) LYMPH NODE BIOPSY (N/A)  1 doing well, stable for discharge, path still pending     [name] E 02/09/2014 8:59 AM

## 2014-02-09 NOTE — Progress Notes (Signed)
Discharge instructions given. No questions or concerns at this time. IJ d/c'd. Tip intact. Pt tolerated well.

## 2014-02-10 LAB — TYPE AND SCREEN
ABO/RH(D): AB POS
Antibody Screen: POSITIVE
DAT, IgG: POSITIVE
Unit division: 0
Unit division: 0

## 2014-02-11 ENCOUNTER — Ambulatory Visit: Payer: Medicare Other | Admitting: Hematology and Oncology

## 2014-02-11 ENCOUNTER — Ambulatory Visit: Payer: Medicare Other

## 2014-02-12 ENCOUNTER — Telehealth: Payer: Self-pay | Admitting: Thoracic Surgery (Cardiothoracic Vascular Surgery)

## 2014-02-12 NOTE — Telephone Encounter (Signed)
Attempted to cal [name] yesterday and today to inform her of pathology results.  No answer either time.   Left message to call our office  I think she went home with her daughter unfortunately I do not have the daughter's phone number

## 2014-02-14 ENCOUNTER — Other Ambulatory Visit: Payer: Self-pay | Admitting: Thoracic Surgery (Cardiothoracic Vascular Surgery)

## 2014-02-14 DIAGNOSIS — R590 Localized enlarged lymph nodes: Secondary | ICD-10-CM

## 2014-02-17 LAB — FUNGUS CULTURE W SMEAR
FUNGAL SMEAR: NONE SEEN
Fungal Smear: NONE SEEN
Special Requests: NORMAL

## 2014-02-18 ENCOUNTER — Encounter: Payer: Self-pay | Admitting: Thoracic Surgery (Cardiothoracic Vascular Surgery)

## 2014-02-18 ENCOUNTER — Ambulatory Visit
Admission: RE | Admit: 2014-02-18 | Discharge: 2014-02-18 | Disposition: A | Discharge status: Home / Self Care | Payer: Medicare Other | Source: Ambulatory Visit | Attending: Thoracic Surgery (Cardiothoracic Vascular Surgery) | Admitting: Thoracic Surgery (Cardiothoracic Vascular Surgery)

## 2014-02-18 ENCOUNTER — Ambulatory Visit (INDEPENDENT_AMBULATORY_CARE_PROVIDER_SITE_OTHER): Payer: Self-pay | Admitting: Thoracic Surgery (Cardiothoracic Vascular Surgery)

## 2014-02-18 DIAGNOSIS — R590 Localized enlarged lymph nodes: Secondary | ICD-10-CM

## 2014-02-18 DIAGNOSIS — C8590 Non-Hodgkin lymphoma, unspecified, unspecified site: Secondary | ICD-10-CM

## 2014-02-18 NOTE — Progress Notes (Signed)
  HPI:  [name] returns today for a scheduled postoperative follow-up visit.  She is a 68 year old woman who had a left VATS for lung and lymph node biopsies on 02/06/2014. This was done for bilateral hilar and mediastinal adenopathy and possible interstitial lung disease. It turned out that all the changes or representative of lymphoma. This was a non-Hodgkin's, B-cell, lymphoma.  Her postoperative course was uncomplicated. She did not take any narcotics after discharge, because she was concerned about their effects on her lungs. She did have some pain but was able to manage with Tylenol. Her pain is improving significantly. She drove herself to the office visit today. She is going to see Dr. Gerald Dexter at [facility]. She wants to have her treatment there because her son lives in [place] and she lives alone here in [place].  Past Medical History  Diagnosis Date  . Dyspnea     increase with exertion  . Headache     "aura" headaches less frequent  . Tachycardia   . Pneumonia     as a child  . Chronic joint pain     Was thought to have lupus 25 + years ago, treated for lupus but recently found that she does not have lupus     Current Outpatient Prescriptions  Medication Sig Dispense Refill  . aspirin 81 MG tablet Take 81 mg by mouth every morning.      No current facility-administered medications for this visit.    Physical Exam BP 135/85 mmHg  Pulse 108  Resp 16  Ht 5\' 4"  (1.626 m)  Wt 131 lb (59.421 kg)  BMI 22.47 kg/m2  SpO64 33% 68 year old woman in no acute distress Alert and oriented 3 Incisions healing well Lungs with faint crackles bilaterally  Diagnostic Tests: CHEST 2 VIEW  COMPARISON: 02/09/2014, 02/08/2014, 02/06/2014, 12/16/2013.  FINDINGS: Mediastinum and hilar structures are normal. Heart size normal. Surgical sutures left upper lung. Persistent prominent bilateral pulmonary infiltrates. A component of the infiltrates may be related to chronic lung disease  however active pneumonia is suspected as these infiltrates have progressed from prior study of 12/16/2013. Left pleural effusion. No pneumothorax. Biapical pleural thickening with nodularity noted. If these findings do not clear chest CT may prove helpful free further evaluation. No acute bony abnormality.  IMPRESSION: 1. Persistent dense bilateral pulmonary infiltrates. Although component of underlying chronic disease may be present active pneumonia is suspected. Small left pleural effusion. These infiltrates do not and approved chest CT may prove useful for further evaluation.  2. Postsurgical changes left upper lobe.   Electronically Signed  By: Marcello Moores Register  On: 02/18/2014 14:37  Impression: 68 year old woman who underwent a left thoracoscopy for lymph node and lung biopsies 2 weeks ago. 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  She is recovering well from the surgery. She is not taking any narcotics. She started driving today did not have any problems with that. At this point her activities are unrestricted, however, she was cautioned to build into new activities gradually.  Plan: She has scheduled an appointment with Dr. Gerald Dexter at [facility] on January 5 or evaluation for treatment for her non-Hodgkin's lymphoma.  I will be happy to see her back any time,if I can be of any further assistance with her care.

## 2014-02-24 ENCOUNTER — Ambulatory Visit: Payer: Medicare Other | Admitting: Adult Health

## 2014-02-25 ENCOUNTER — Ambulatory Visit: Payer: Medicare Other | Admitting: Thoracic Surgery (Cardiothoracic Vascular Surgery)

## 2014-03-04 LAB — AFB CULTURE WITH SMEAR (NOT AT ARMC): ACID FAST SMEAR: NONE SEEN

## 2014-03-06 LAB — AFB CULTURE WITH SMEAR (NOT AT ARMC)
Acid Fast Smear: NONE SEEN
SPECIAL REQUESTS: NORMAL

## 2014-03-09 LAB — FUNGUS CULTURE W SMEAR
Fungal Smear: NONE SEEN
Fungal Smear: NONE SEEN
Fungal Smear: NONE SEEN

## 2014-03-21 LAB — AFB CULTURE WITH SMEAR (NOT AT ARMC)
ACID FAST SMEAR: NONE SEEN
Acid Fast Smear: NONE SEEN
Acid Fast Smear: NONE SEEN

## 2014-04-07 DEATH — deceased

## 2014-09-01 ENCOUNTER — Other Ambulatory Visit: Payer: Self-pay

## 2015-06-22 IMAGING — CT CT CHEST W/ CM
2 of 3 series · 15 of 36 positions shown, 18 images · IV contrast (75CC OMNI 300)
Comparison: None.

CLINICAL DATA: Shortness of breath and pulmonary infiltration.

EXAM:
CT CHEST WITH CONTRAST
TECHNIQUE: Multidetector CT imaging of the chest was performed during
intravenous contrast administration.
CONTRAST:  75mL OMNIPAQUE IOHEXOL 300 MG/ML  SOLN

[Series 2: chest with · axial · 0.64mm/px · z∈[-357,-72]mm · 12 of 67 slices shown, 15 images]
[im 5/67  mediastinal]
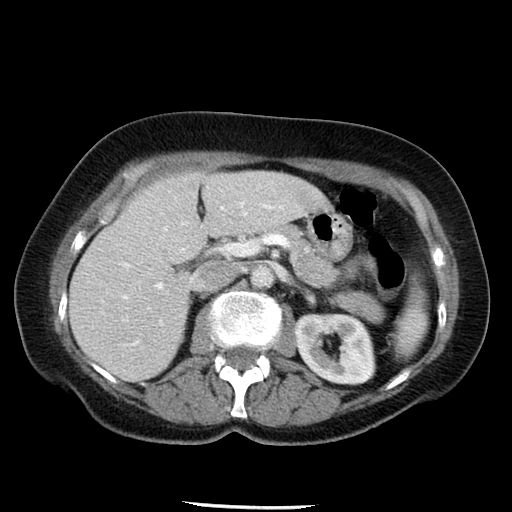
[im 5/67  lung]
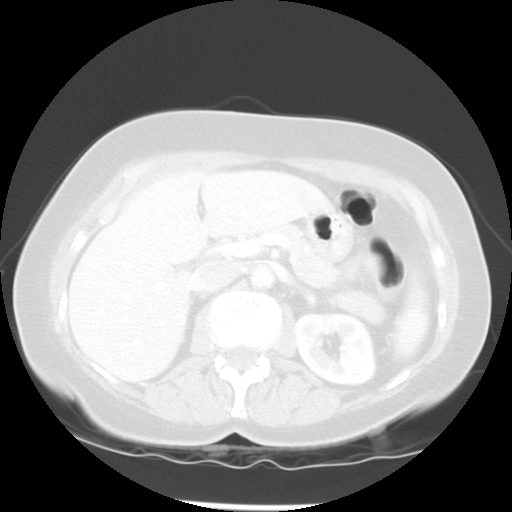
[im 10/67  lung]
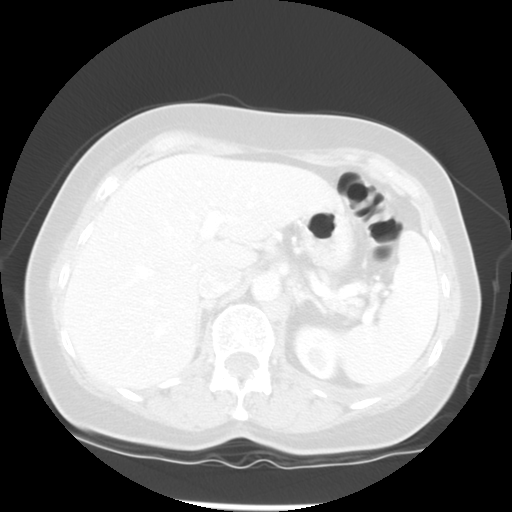
[im 15/67  lung]
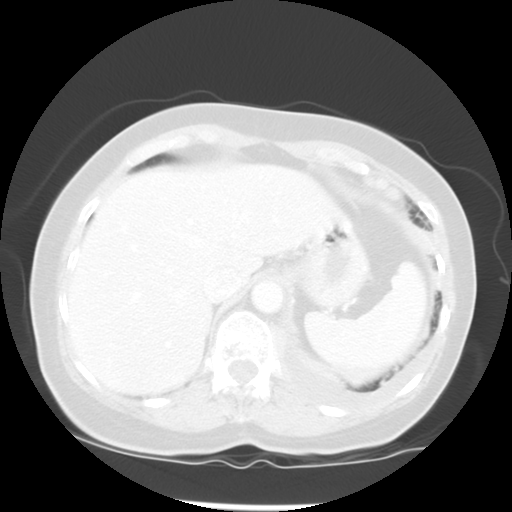
[im 20/67  lung]
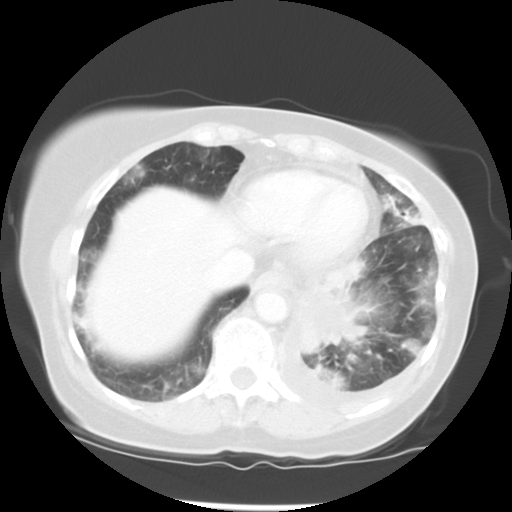
[im 25/67  mediastinal]
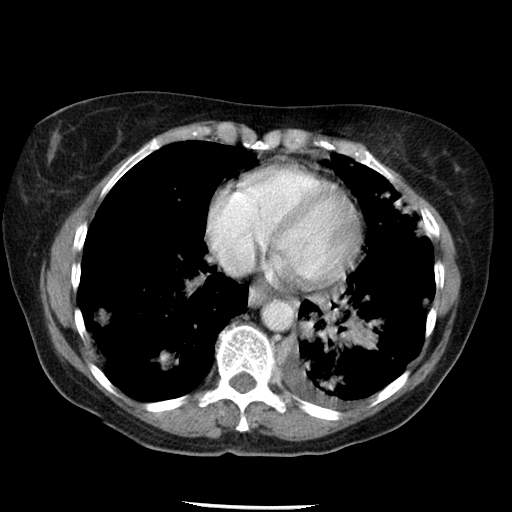
[im 25/67  lung]
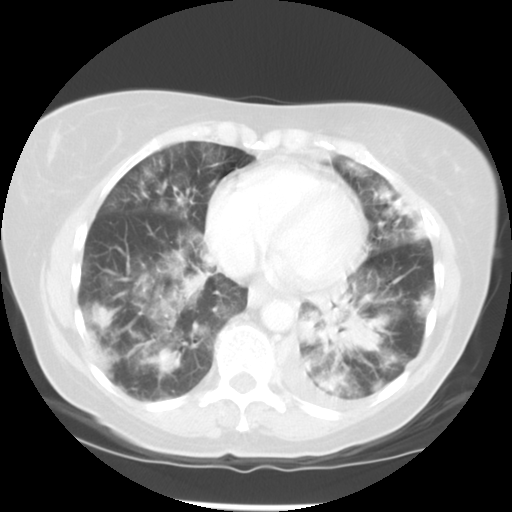
[im 30/67  lung]
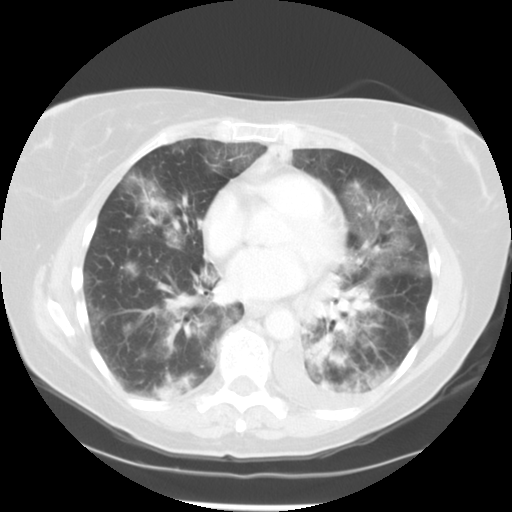
[im 37/67  lung]
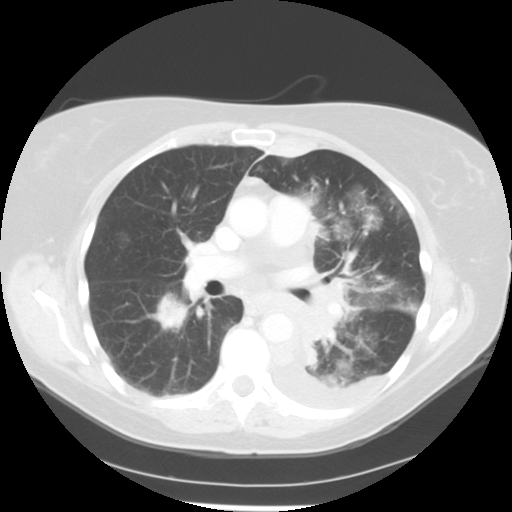
[im 42/67  lung]
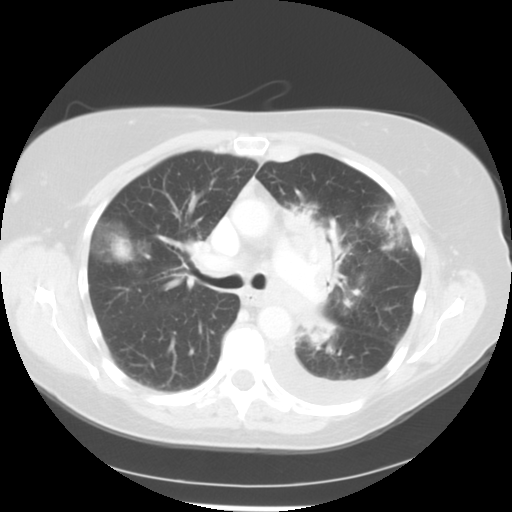
[im 47/67  mediastinal]
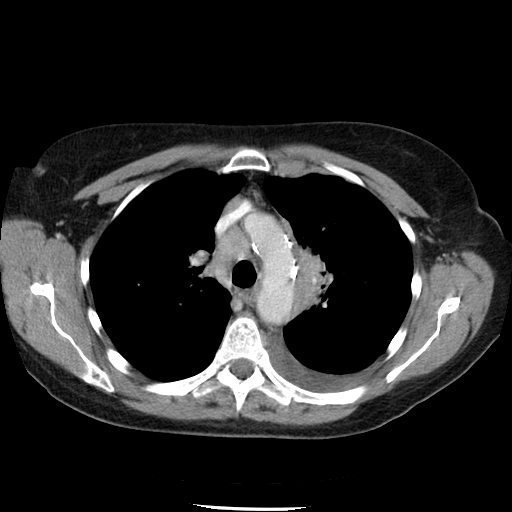
[im 47/67  lung]
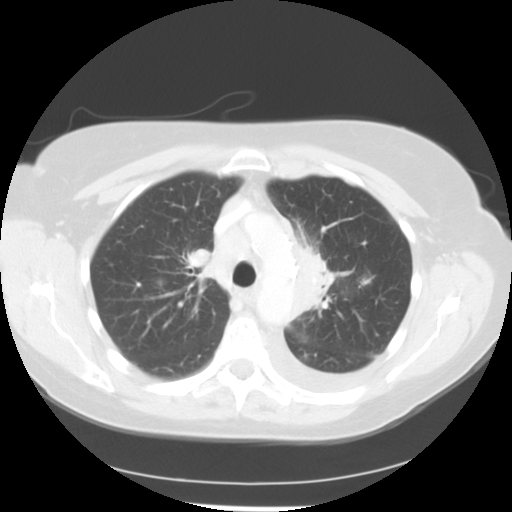
[im 52/67  lung]
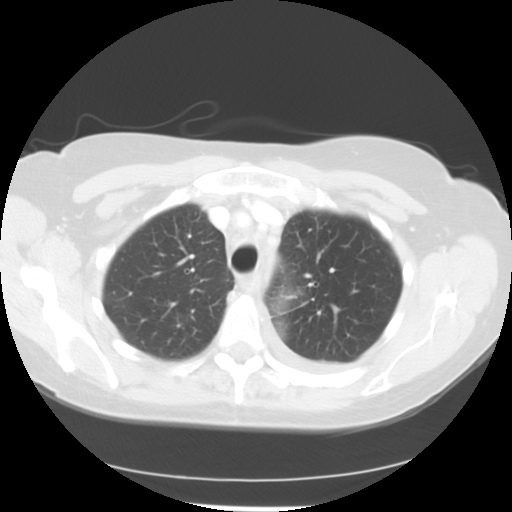
[im 57/67  lung]
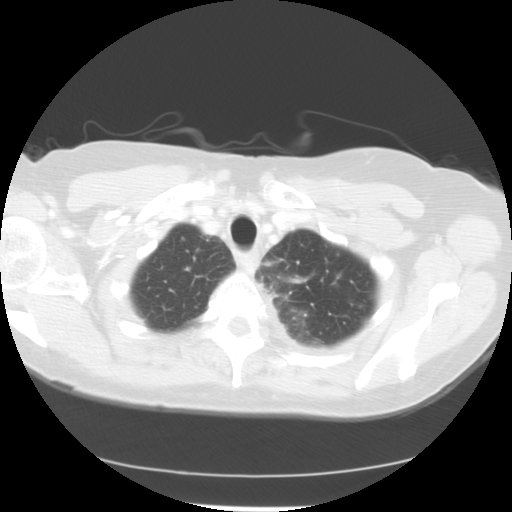
[im 62/67  lung]
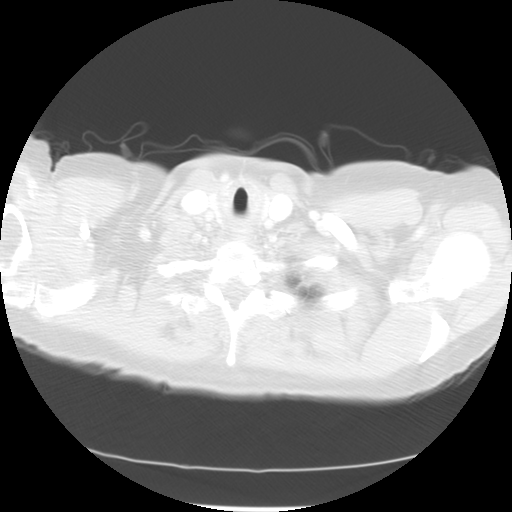

[Series 400: cor · coronal · 0.66mm/px · 3 of 115 slices shown]
[im 23/115  lung]
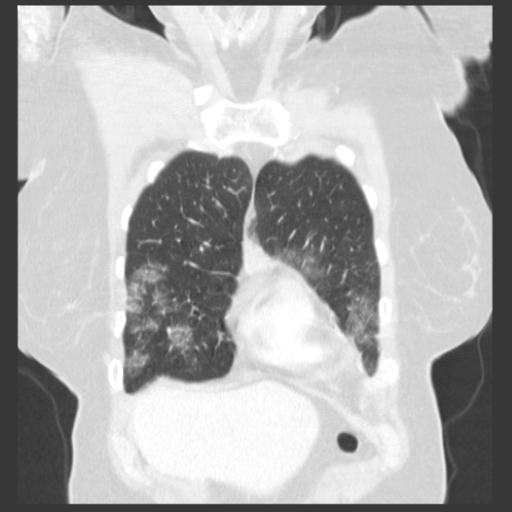
[im 46/115  lung]
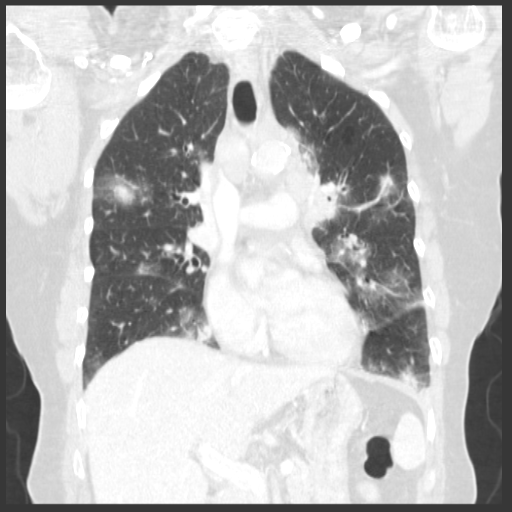
[im 69/115  lung]
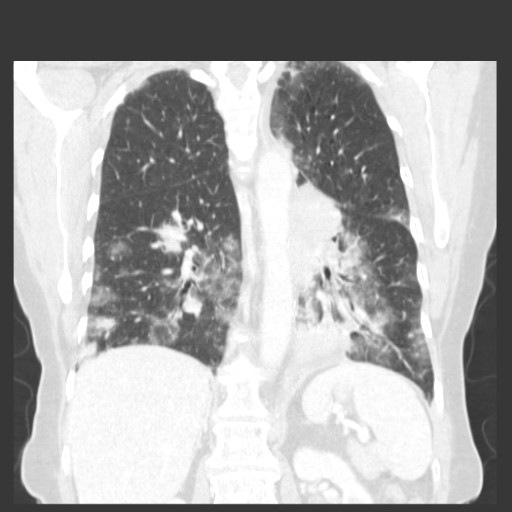

[15 of 36 positions shown; findings below may reference images not displayed]

FINDINGS: THORACIC INLET/BODY WALL:

No acute abnormality.

MEDIASTINUM:

There is no cardiomegaly. No pericardial effusion. Atherosclerosis,
including the markedly narrowed left subclavian artery near the
vertebral artery origin. The left vertebral artery is grossly
patent.

There is confluent masses in the bilateral hila, infiltrating the
middle mediastinum and partially encasing the descending thoracic
aorta. There is subpleural and pleural extension in the left
posterior chest. Irregular-shaped limits reproducible size
measurement, but for estimation purposes, left hilar mass measures
greater than 4 cm. The mass results in narrowing and distortion of
the pulmonary arterial tree diffusely. There is no central pulmonary
filling defect identified. No acute aortic findings.

LUNG WINDOWS:

Patchy bilateral lung opacities, present in all lobes. There is a
basilar predominance. These have ground-glass predominantly,
although there is also dense consolidation with air bronchograms.
Centrally the nodules are denser. Small left pleural effusion.

UPPER ABDOMEN:

11 mm and 14 mm short axis adenopathy around left diaphragm crus.

OSSEOUS:

There is a lucency within the posterior T12 body which appears
geographic and low-density, favoring a hemangioma over metastatic
disease.

These results will be called to the ordering clinician or
representative by the Radiologist Assistant, and communication
documented in the PACS or zVision Dashboard.
IMPRESSION: 1. Diffuse malignant-appearing adenopathy in the pulmonary hila and
mediastinum. There is vessel encasement, left pleural involvement,
and upper abdominal adenopathy. The appearance favors lymphoma.
2. Diffuse bilateral airspace disease which could reflect a
atypical/opportunistic infection or metastatic disease.
3. T12 vertebral body lucency, favor hemangioma. Attention on
follow-up.
4. High-grade atherosclerotic stenosis of the left subclavian
artery, at the vertebral ostium.

## 2015-08-04 IMAGING — CR DG CHEST 1V PORT
1 series · 1 of 1 positions shown · non-contrast
Comparison: 02/06/2014

CLINICAL DATA: Left central line placement post VATS

EXAM:
PORTABLE CHEST - 1 VIEW

[AP]
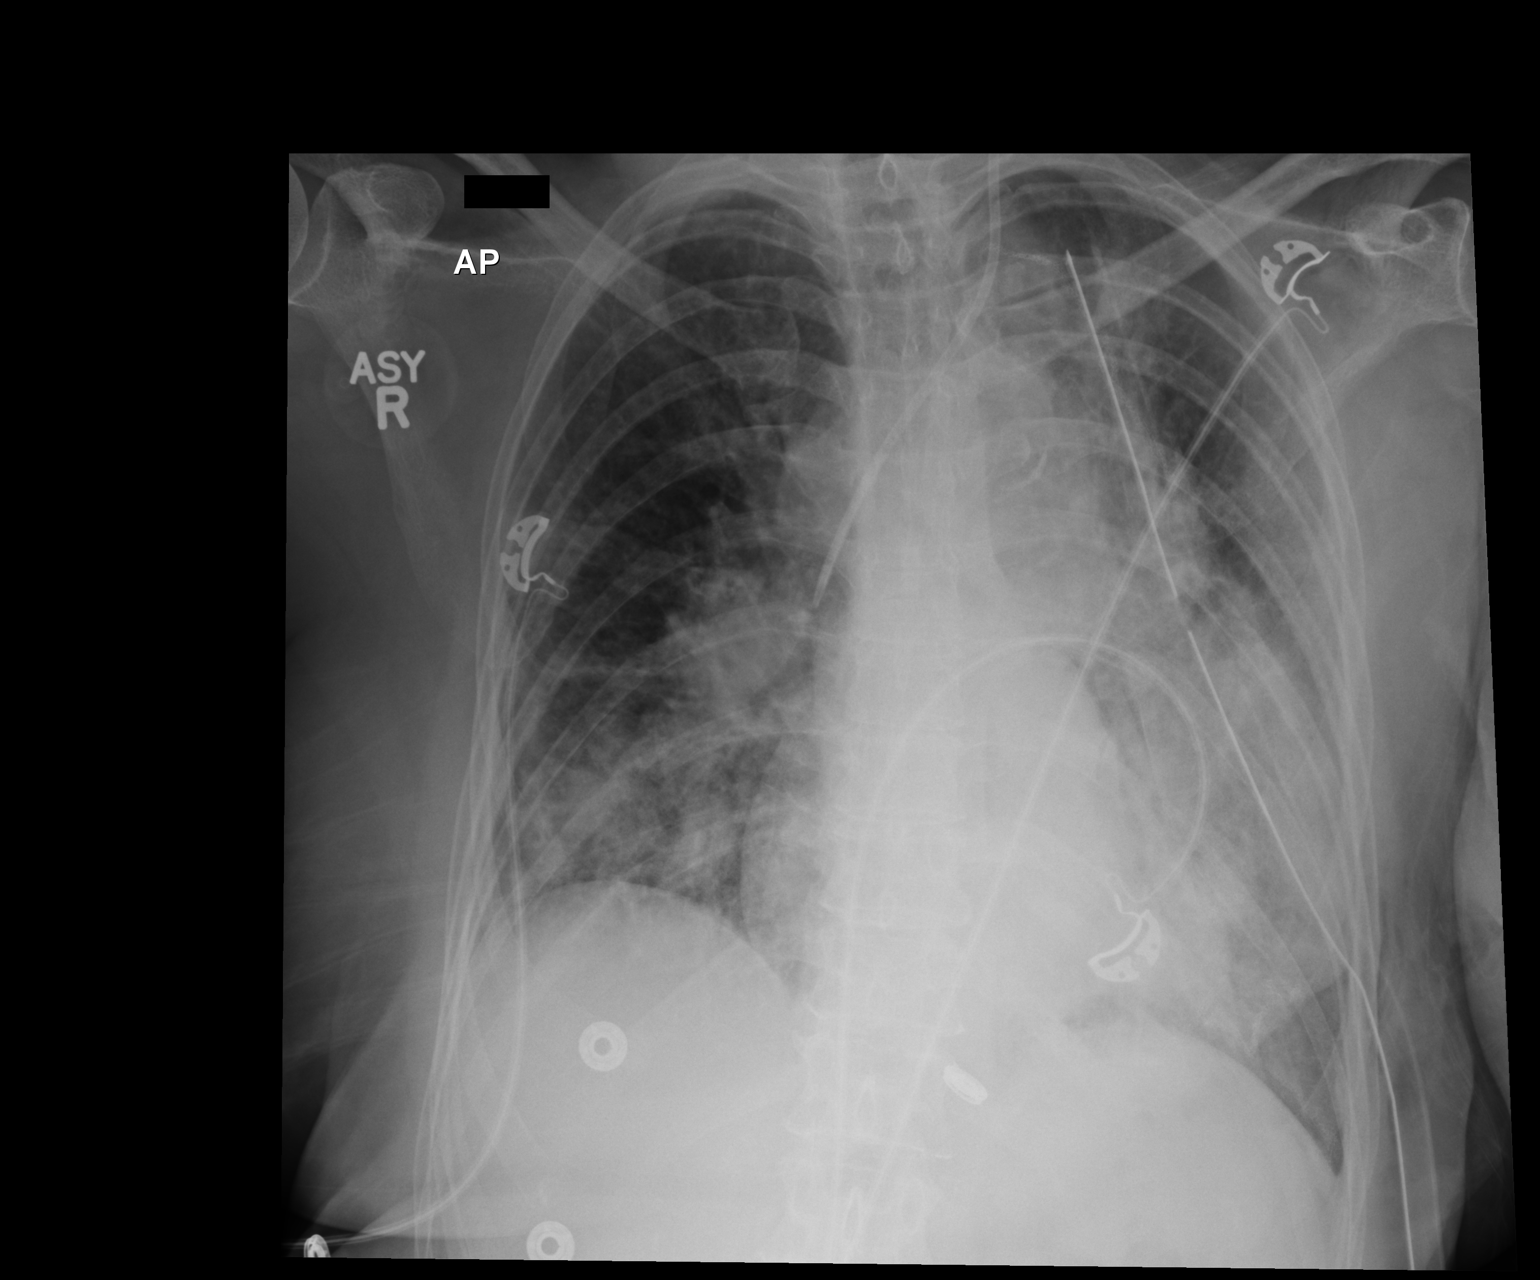

[1 of 1 positions shown; findings below may reference images not displayed]

FINDINGS: Cardiomediastinal silhouette is stable. Patchy bilateral lower lobe
infiltrates again noted. There is a left chest tube in place. No
pneumothorax. Left IJ central line with tip in SVC. Bilateral hilar
adenopathy again noted.
IMPRESSION: Patchy bilateral lower lobe infiltrates again noted. There is a left
chest tube in place. No pneumothorax. Left IJ central line with tip
in SVC.

## 2015-08-06 IMAGING — CR DG CHEST 1V PORT
1 series · 1 of 1 positions shown · non-contrast
Comparison: 02/07/2014

CLINICAL DATA: Pulmonary infiltrates

EXAM:
PORTABLE CHEST - 1 VIEW

[AP]
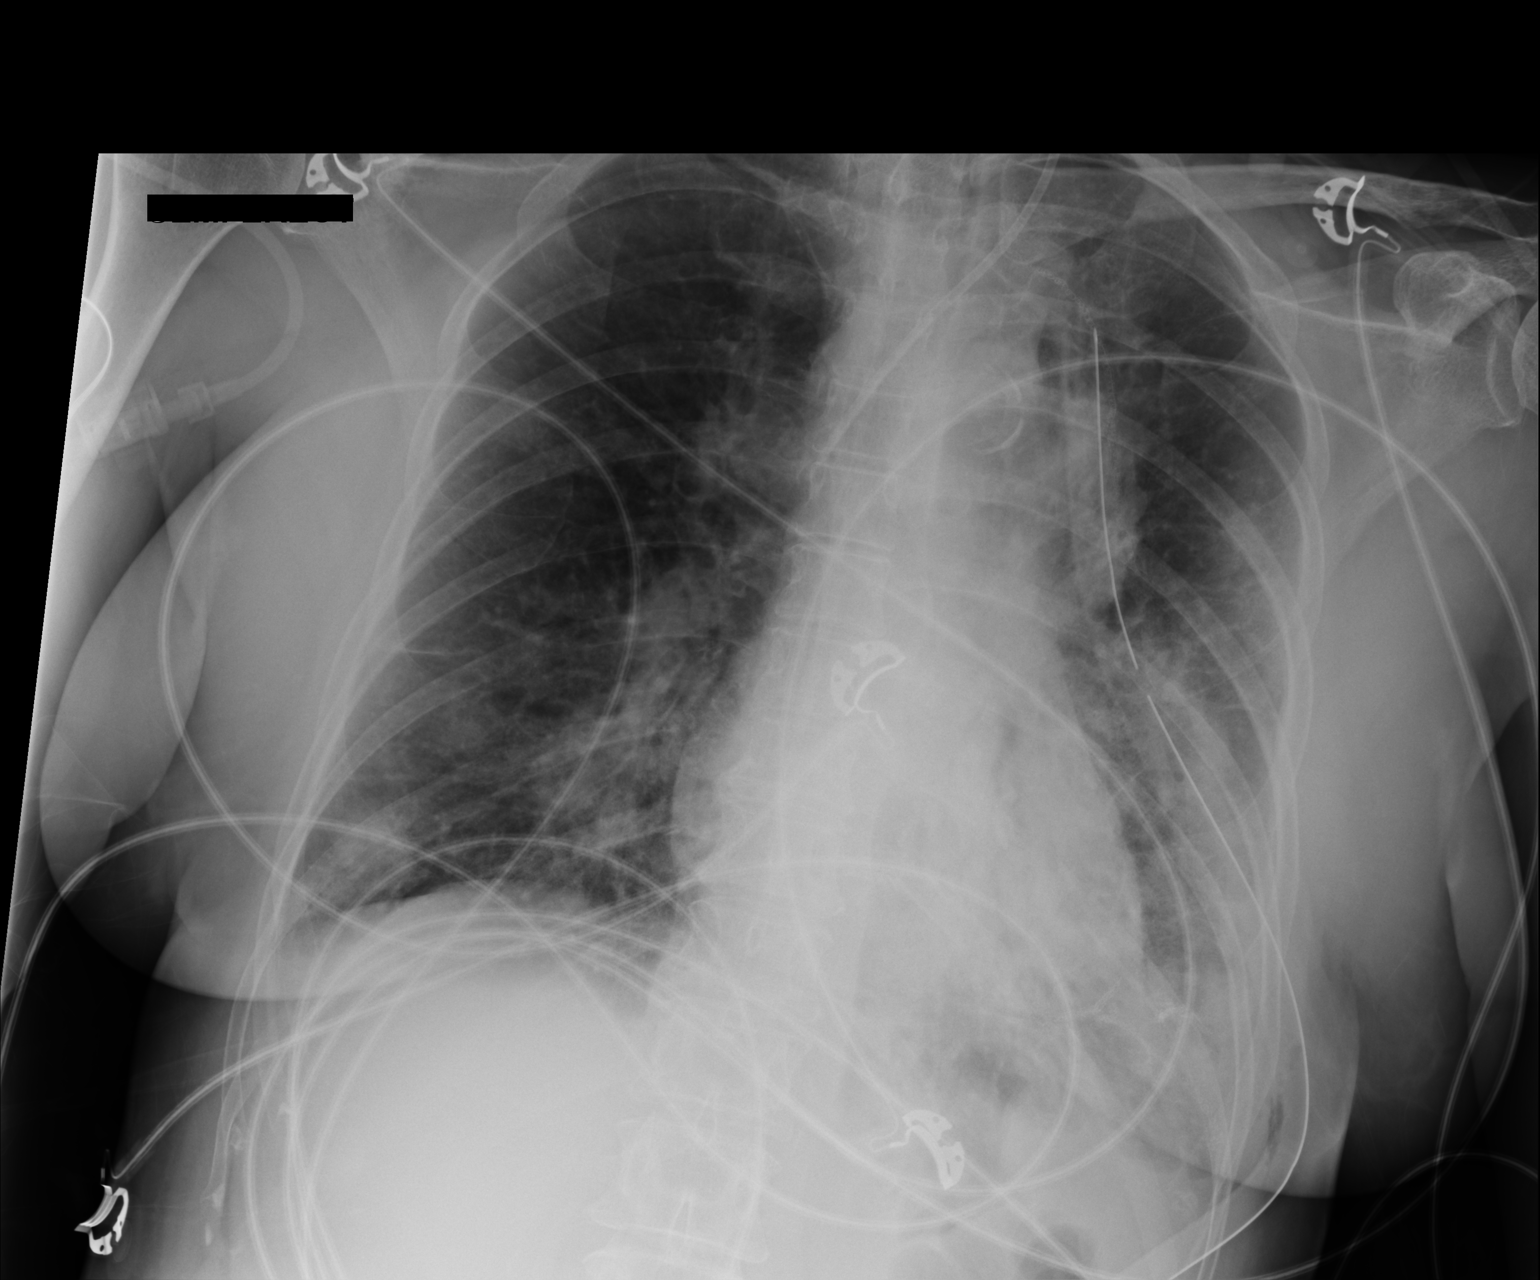

[1 of 1 positions shown; findings below may reference images not displayed]

FINDINGS: Left jugular central venous catheter in unchanged position.

Left sided chest tube in unchanged position. Small left apical
pneumothorax measuring 4 mm.

Bilateral patchy lower lobe airspace disease, left greater than
right, similar in appearance to the prior examination. No
significant pleural effusion. Stable cardiomediastinal silhouette.
Thoracic aortic atherosclerosis. No acute osseous abnormality.
IMPRESSION: 1. Left sided chest tube with a small left apical pneumothorax.
2. Bilateral patchy airspace disease, left greater than right, most
consistent with persistent pneumonia. Mild improved aeration at the
left lung base.
